# Patient Record
Sex: Female | Born: 1970 | Race: White | Hispanic: No | Marital: Married | Smoking: Never smoker
Health system: Southern US, Community
[De-identification: ages and names within clinical notes are randomized; demographics above are authoritative.]

## PROBLEM LIST (undated history)

## (undated) HISTORY — PX: MOUTH SURGERY: SHX715

---

## 1998-12-14 ENCOUNTER — Ambulatory Visit (HOSPITAL_COMMUNITY): Admission: RE | Admit: 1998-12-14 | Discharge: 1998-12-14 | Payer: Self-pay | Admitting: Obstetrics and Gynecology

## 1998-12-14 ENCOUNTER — Encounter: Payer: Self-pay | Admitting: Obstetrics and Gynecology

## 1999-02-22 ENCOUNTER — Ambulatory Visit (HOSPITAL_COMMUNITY): Admission: RE | Admit: 1999-02-22 | Discharge: 1999-02-22 | Payer: Self-pay | Admitting: Obstetrics and Gynecology

## 1999-03-18 ENCOUNTER — Ambulatory Visit (HOSPITAL_COMMUNITY): Admission: RE | Admit: 1999-03-18 | Discharge: 1999-03-18 | Payer: Self-pay | Admitting: Obstetrics and Gynecology

## 1999-03-18 ENCOUNTER — Encounter: Payer: Self-pay | Admitting: Obstetrics and Gynecology

## 1999-04-28 ENCOUNTER — Encounter (INDEPENDENT_AMBULATORY_CARE_PROVIDER_SITE_OTHER): Payer: Self-pay | Admitting: Specialist

## 1999-04-28 ENCOUNTER — Inpatient Hospital Stay (HOSPITAL_COMMUNITY): Admission: AD | Admit: 1999-04-28 | Discharge: 1999-05-01 | Payer: Self-pay | Admitting: Obstetrics and Gynecology

## 1999-06-11 ENCOUNTER — Other Ambulatory Visit: Admission: RE | Admit: 1999-06-11 | Discharge: 1999-06-11 | Payer: Self-pay | Admitting: Obstetrics and Gynecology

## 2000-02-06 ENCOUNTER — Other Ambulatory Visit: Admission: RE | Admit: 2000-02-06 | Discharge: 2000-02-06 | Payer: Self-pay | Admitting: Obstetrics and Gynecology

## 2000-02-27 ENCOUNTER — Encounter (INDEPENDENT_AMBULATORY_CARE_PROVIDER_SITE_OTHER): Payer: Self-pay | Admitting: Specialist

## 2000-02-27 ENCOUNTER — Other Ambulatory Visit: Admission: RE | Admit: 2000-02-27 | Discharge: 2000-02-27 | Payer: Self-pay | Admitting: Obstetrics and Gynecology

## 2000-07-16 ENCOUNTER — Other Ambulatory Visit: Admission: RE | Admit: 2000-07-16 | Discharge: 2000-07-16 | Payer: Self-pay | Admitting: Obstetrics and Gynecology

## 2000-08-13 ENCOUNTER — Ambulatory Visit (HOSPITAL_COMMUNITY): Admission: RE | Admit: 2000-08-13 | Discharge: 2000-08-13 | Payer: Self-pay | Admitting: Obstetrics and Gynecology

## 2000-08-13 ENCOUNTER — Encounter: Payer: Self-pay | Admitting: Obstetrics and Gynecology

## 2000-11-11 ENCOUNTER — Other Ambulatory Visit: Admission: RE | Admit: 2000-11-11 | Discharge: 2000-11-11 | Payer: Self-pay | Admitting: Obstetrics and Gynecology

## 2000-12-11 ENCOUNTER — Inpatient Hospital Stay (HOSPITAL_COMMUNITY): Admission: AD | Admit: 2000-12-11 | Discharge: 2000-12-11 | Payer: Self-pay | Admitting: Obstetrics and Gynecology

## 2000-12-29 ENCOUNTER — Inpatient Hospital Stay (HOSPITAL_COMMUNITY): Admission: AD | Admit: 2000-12-29 | Discharge: 2000-12-31 | Payer: Self-pay | Admitting: Obstetrics and Gynecology

## 2001-02-10 ENCOUNTER — Other Ambulatory Visit: Admission: RE | Admit: 2001-02-10 | Discharge: 2001-02-10 | Payer: Self-pay | Admitting: Obstetrics and Gynecology

## 2008-05-27 ENCOUNTER — Emergency Department (HOSPITAL_COMMUNITY): Admission: EM | Admit: 2008-05-27 | Discharge: 2008-05-27 | Payer: Self-pay | Admitting: Emergency Medicine

## 2010-06-07 NOTE — Discharge Summary (Signed)
[facility]  PatientLEI, Madison Garner Visit Number: [id number] MRN: [id number]          Service Type: OBS Location: [id number] Attending Physician:  Madison Garner Dictated by:   Malachi Pro. Ambrose Mantle, M.D. Admit Date:  12/29/2000 Discharge Date: 12/31/2000                             Discharge Summary  HISTORY OF PRESENT ILLNESS:   A 40 year old white single female, Para 1-0-0-1, Gravida 2, last period April 04, 2000, [facility] January 11, 2001 by dates and January 08, 2001 by ultrasound, admitted with prodromal labor.  Blood group and type A positive with a negative antibody. Nonreactive serology.  Rubella immune.  Hepatitis B surface antigen negative.  HIV declined.  GC and chlamydia  negative.  Triple screen normal.  One hour Glucola 100.  Group B Strep negative.  Vaginal ultrasound on May 28, 2000 crown rump 1.4 cm, 7 weeks 6 days, [facility] January 08, 2001.  Colposcopy at 16 weeks showed a large white area around cervical os with punctation.  Mosaicism at 11 to 12:00. Colposcopy repeated at 32 weeks with no change.  Repeat ultrasound August 13, 2000 mean gestational age [redacted] weeks 0 days, [facility] January 07, 2001.  The patient came for a visit on December 23, 2000, cervix was 3 cm.  She came again today complaining of contractions.  Cervix was 3+ cm and the patient came a maternity admission for evaluation.  PAST MEDICAL HISTORY:         No known allergies.  Migraines.  Operations: None.  FAMILY HISTORY:               Mother with MI, skin and cervical cancer. Bother with obsessive compulsive disorder and depression.  ALCOHOL/TOBACCO/DRUGS:        None.  OBSTETRIC HISTORY:            April 2001 a 6 pound 4 ounce female with premature rupture of membranes.  PHYSICAL EXAMINATION:         On admission revealed normal vital signs.  The abdomen was soft and nontender.  Fundal height was 36 cm on December 23, 2000. Cervix 3+ cm, 50%, vertex minus 2 to  minus 3 station.  HOSPITAL COURSE:              After admission to the  hospital at 6:13 p.m. the cervix was 3+ cm, 50%, vertex at minus 2.  Artificial rupture of membranes produced meconium stained fluid.  Pitocin was begun.  The patient then established a labor pattern. Received an epidural at 8:40 p.m.  At 9:10 p.m. the Pitocin was 8 mu a minute.  Contractions were every two to three minutes. The cervix was 8 cm, 100% vertex at 0 station.  She progressed to full dilatation and delivered spontaneously OA over a second degree midline laceration a living female infant 7 pounds 2 ounces, Apgars 9 at one and 9 at five minutes.  There was one loop of nuchal cord and there was mild shoulder dystocia managed by wood screw and McRoberts maneuvers.  The nose and pharynx were suctioned with the DeLee and the bulb and Dr. Salvadore Dom attended the baby. Intubation was not necessary.  Placenta was intact.  Uterus normal.  Rectal negative.  Second degree midline laceration repaired with 3-0 Dexon.  Blood loss was about 400 cc.  Dr. Ambrose Mantle was in  attendance.  Postpartum the patient did quite well and was discharged on the second postpartum day.  Initial hemoglobin 11.0, hematocrit 31.9, white count 10,900.  Platelet count 185,000.  Follow up hemoglobin 10.2, hematocrit 29.6, platelet count 172,000. White count 16,200.  RPR nonreactive.  FINAL DIAGNOSIS:              Intrauterine pregnancy at 38+ weeks, delivered                               occiput anterior.  OPERATION:                    Spontaneous delivery OA, repair of second                               degree midline laceration.  FINAL CONDITION:              Improved.  INSTRUCTIONS:                 Include our regular discharge instruction booklet.  The patient is advised to return to the office in six weeks for follow up examination.  She declines analgesics at discharge. Dictated by:   Malachi Pro. Ambrose Mantle, M.D. Attending Physician:  Madison Garner DD:  12/31/00 TD:  12/31/00 Job: [id number]

## 2010-06-07 NOTE — Discharge Summary (Signed)
[facility]  Patient:    Madison Garner, Madison Garner                     MRN: [id number] Adm. Date:  [id number] Disc. Date: [id number] Attending:  Oliver Pila                           Discharge Summary  ADMISSION DIAGNOSES:          1. Intrauterine pregnancy at 38 weeks.                               2. Premature rupture of membranes.  DISCHARGE DIAGNOSES:          1. Intrauterine pregnancy at 38 weeks.                               2. Premature rupture of membranes.  PROCEDURE:                    Spontaneous vaginal delivery.  COMPLICATIONS:                None.  CONSULTING PHYSICIANS:        None.  HISTORY OF PRESENT ILLNESS:   This is a 40 year old white female, gravida 1, para 0, with an EGA of [redacted] weeks by an LMP consistent with a first trimester ultrasound with an EDC of May 13, 1999, who presents with the complaint of questionable rupture of membranes.  She was unsure how long she had been ruptured.  She has een felt damp since the day prior to admission with good fetal movement, no vaginal  bleeding, and irregular mild contractions.  Prenatal care complicated by a placenta previa early on in pregnancy which resolved by a 32-week ultrasound.  It was otherwise uncomplicated.  PRENATAL LABORATORY DATA:     Blood type A positive with a negative antibody screen, RPR nonreactive, HIV negative, hepatitis B surface antigen negative, rubella immune.  Gonorrhea and Chlamydia negative.  Group B Strep was negative.   PAST MEDICAL HISTORY:         Significant for a history of an ASCUS Pap smear in April of 1999 and a history of migraine headaches and she has recently taken amoxicillin for sinusitis.  PHYSICAL EXAMINATION:         VITAL SIGNS: She was afebrile with stable vital signs.  Fetal heart rate tracing was reactive in the 130s.  Fundus was 38 cm, estimated fetal weight was 6-1/2 to 7 pounds.  Cervix was 2, 25%, -2 with positive pooling,  positive nitrazine, and Alvino Chapel, M.D. was able to perform artificial rupture of membranes of a forebag.  HOSPITAL COURSE:              The patient was admitted with premature rupture of membranes and started on low dose Pitocin induction after the forebag was ruptured. She progressed in active labor and received an epidural.  On the morning of April 29, 1999, she reached complete and pushed well and had an SVD of a viable female  infant with Apgars of 7 and 8 that weighed 6 pounds 8 ounces over a second degree laceration.  There was thick meconium noted after the vertex delivered and DeLee suctioned, also returned some thick meconium.  The NICU team was called.  They lso retrieved thick meconium from the stomach.  There was a nuchal cord x 2 and one of these loops was reduced.  The placenta was delivered spontaneous and was intact. Her second degree laceration was repaired with 3-0 Vicryl and her cervix and rectum were intact and estimated blood loss was less than 500 cc.  Postpartum, the patient did very well, remained afebrile, and breastfed her baby without complications. On the morning of postpartum day #2, she was stable for discharge home.  CONDITION ON DISCHARGE:       Stable.  DISPOSITION:                  Discharged to home.  DISCHARGE INSTRUCTIONS:       Her diet is a regular diet.  Her activity is pelvic rest.  FOLLOW-UP:                    Her follow-up is in four to six weeks and she is given our discharge summary pamphlet. DD:  05/01/99 TD:  05/01/99 Job: [id number]

## 2010-11-12 ENCOUNTER — Ambulatory Visit: Payer: Self-pay

## 2011-08-29 ENCOUNTER — Ambulatory Visit (INDEPENDENT_AMBULATORY_CARE_PROVIDER_SITE_OTHER): Payer: BC Managed Care – PPO | Admitting: Emergency Medicine

## 2011-08-29 VITALS — BP 126/80 | HR 81 | Temp 98.7°F | Resp 16 | Ht 60.0 in | Wt 185.0 lb

## 2011-08-29 DIAGNOSIS — R599 Enlarged lymph nodes, unspecified: Secondary | ICD-10-CM

## 2011-08-29 LAB — POCT CBC
Granulocyte percent: 68.4 %G (ref 37–80)
HCT, POC: 44.8 % (ref 37.7–47.9)
Hemoglobin: 13.9 g/dL (ref 12.2–16.2)
Lymph, poc: 2.4 (ref 0.6–3.4)
MCH, POC: 28.5 pg (ref 27–31.2)
MCHC: 31 g/dL — AB (ref 31.8–35.4)
MCV: 91.9 fL (ref 80–97)
MID (cbc): 0.6 (ref 0–0.9)
MPV: 8.5 fL (ref 0–99.8)
POC Granulocyte: 6.4 (ref 2–6.9)
POC LYMPH PERCENT: 25.5 %L (ref 10–50)
POC MID %: 6.1 %M (ref 0–12)
Platelet Count, POC: 387 10*3/uL (ref 142–424)
RBC: 4.87 M/uL (ref 4.04–5.48)
RDW, POC: 12.6 %
WBC: 9.4 10*3/uL (ref 4.6–10.2)

## 2011-08-29 LAB — POCT RAPID STREP A (OFFICE): Rapid Strep A Screen: NEGATIVE

## 2011-08-29 MED ORDER — DOXYCYCLINE HYCLATE 100 MG PO TABS: 100.0000 mg | ORAL_TABLET | Freq: Two times a day (BID) | ORAL | Status: AC

## 2011-08-29 NOTE — Progress Notes (Signed)
  Subjective:    Patient ID: Madison Garner, female    DOB: 24-Dec-1970, 41 y.o.   MRN: [id number]  HPIpatient presents with 2 day onset of lymph nodes swollen, posterior neck, they feel tender and like they are on fire. Denies fever or sore throat. Denies  sores or scalp problems. Patient otherwise healthy and denies any other complaints.     Review of Systems     Objective:   Physical Exam  On exam, patients tonsils do not appear enlarged, she does have a few small sores with her scalp area, does state she scratches this area a lot.   Results for orders placed in visit on 08/29/11  POCT RAPID STREP A (OFFICE)      Component Value Range   Rapid Strep A Screen Negative  Negative  POCT CBC      Component Value Range   WBC 9.4  4.6 - 10.2 K/uL   Lymph, poc 2.4  0.6 - 3.4   POC LYMPH PERCENT 25.5  10 - 50 %L   MID (cbc) 0.6  0 - 0.9   POC MID % 6.1  0 - 12 %M   POC Granulocyte 6.4  2 - 6.9   Granulocyte percent 68.4  37 - 80 %G   RBC 4.87  4.04 - 5.48 M/uL   Hemoglobin 13.9  12.2 - 16.2 g/dL   HCT, POC 13.0  86.5 - 47.9 %   MCV 91.9  80 - 97 fL   MCH, POC 28.5  27 - 31.2 pg   MCHC 31.0 (*) 31.8 - 35.4 g/dL   RDW, POC 78.4     Platelet Count, POC 387  142 - 424 K/uL   MPV 8.5  0 - 99.8 fL      Assessment & Plan:  Her blood count is normal. Suspect her symptoms are secondary to folliculitis of the scalp and secondary lymph adenopathy. We'll treat with doxycycline twice a day .

## 2011-08-29 NOTE — Patient Instructions (Signed)
Folliculitis  °Folliculitis is an infection and inflammation of the hair follicles. Hair follicles become red and irritated. This inflammation is usually caused by bacteria. The bacteria thrive in warm, moist environments. This condition can be seen anywhere on the body.  °CAUSES °The most common cause of folliculitis is an infection by germs (bacteria). Fungal and viral infections can also cause the condition. Viral infections may be more common in people whose bodies are unable to fight disease well (weakened immune systems). Examples include people with: °· AIDS.  °· An organ transplant.  °· Cancer.  °People with depressed immune systems, diabetes, or obesity, have a greater risk of getting folliculitis than the general population. Certain chemicals, especially oils and tars, also can cause folliculitis. °SYMPTOMS °· An early sign of folliculitis is a small, white or yellow pus-filled, itchy lesion (pustule). These lesions appear on a red, inflamed follicle. They are usually less than 5 mm (.20 inches).  °· The most likely starting points are the scalp, thighs, legs, back and buttocks. Folliculitis is also frequently found in areas of repeated shaving.  °· When an infection of the follicle goes deeper, it becomes a boil or furuncle. A group of closely packed boils create a larger lesion (a carbuncle). These sores (lesions) tend to occur in hairy, sweaty areas of the body.  °TREATMENT  °· A doctor who specializes in skin problems (dermatologists) treats mild cases of folliculitis with antiseptic washes.  °· They also use a skin application which kills germs (topical antibiotics). Tea tree oil is a good topical antiseptic as well. It can be found at a health food store. A small percentage of individuals may develop an allergy to the tea tree oil.  °· Mild to moderate boils respond well to warm water compresses applied three times daily.  °· In some cases, oral antibiotics should be taken with the skin treatment.    °· If lesions contain large quantities of pus or fluid, your caregiver may drain them. This allows the topical antibiotics to get to the affected areas better.  °· Stubborn cases of folliculitis may respond to laser hair removal. This process uses a high intensity light beam (a laser) to destroy the follicle and reduces the scarring from folliculitis. After laser hair removal, hair will no longer grow in the laser treated area.  °Patients with long-lasting folliculitis need to find out where the infection is coming from. Germs can live in the nostrils of the patient. This can trigger an outbreak now and then. Sometimes the bacteria live in the nostrils of a family member. This person does not develop the disorder but they repeatedly re-expose others to the germ. To break the cycle of recurrence in the patient, the family member must also undergo treatment. °PREVENTION  °· Individuals who are predisposed to folliculitis should be extremely careful about personal hygiene.  °· Application of antiseptic washes may help prevent recurrences.  °· A topical antibiotic cream, mupirocin (Bactroban®), has been effective at reducing bacteria in the nostrils. It is applied inside the nose with your little finger. This is done twice daily for a week. Then it is repeated every 6 months.  °· Because follicle disorders tend to come back, patients must receive follow-up care. Your caregiver may be able to recognize a recurrence before it becomes severe.  °SEEK IMMEDIATE MEDICAL CARE IF:  °· You develop redness, swelling, or increasing pain in the area.  °· You have a fever.  °· You are not improving with treatment   or are getting worse.  °· You have any other questions or concerns.  °Document Released: 03/17/2001 Document Revised: 12/26/2010 Document Reviewed: 01/12/2008 °ExitCare® Patient Information ©2012 [employer]. °

## 2012-11-17 ENCOUNTER — Ambulatory Visit: Payer: Self-pay | Admitting: General Practice

## 2013-02-16 DIAGNOSIS — N920 Excessive and frequent menstruation with regular cycle: Secondary | ICD-10-CM | POA: Insufficient documentation

## 2013-02-21 ENCOUNTER — Encounter: Payer: Self-pay | Admitting: Obstetrics & Gynecology

## 2013-03-09 ENCOUNTER — Ambulatory Visit: Payer: BC Managed Care – PPO | Admitting: Obstetrics & Gynecology

## 2013-03-15 ENCOUNTER — Ambulatory Visit: Payer: BC Managed Care – PPO | Admitting: Obstetrics

## 2013-03-30 ENCOUNTER — Encounter: Payer: Self-pay | Admitting: Obstetrics

## 2013-03-30 ENCOUNTER — Ambulatory Visit (INDEPENDENT_AMBULATORY_CARE_PROVIDER_SITE_OTHER): Payer: BC Managed Care – PPO | Admitting: Obstetrics

## 2013-03-30 VITALS — BP 145/83 | HR 76 | Wt 186.0 lb

## 2013-03-30 DIAGNOSIS — Z113 Encounter for screening for infections with a predominantly sexual mode of transmission: Secondary | ICD-10-CM

## 2013-03-30 DIAGNOSIS — Z01419 Encounter for gynecological examination (general) (routine) without abnormal findings: Secondary | ICD-10-CM

## 2013-03-30 DIAGNOSIS — Z0000 Encounter for general adult medical examination without abnormal findings: Secondary | ICD-10-CM

## 2013-03-30 DIAGNOSIS — N920 Excessive and frequent menstruation with regular cycle: Secondary | ICD-10-CM

## 2013-03-30 DIAGNOSIS — Z124 Encounter for screening for malignant neoplasm of cervix: Secondary | ICD-10-CM

## 2013-03-30 LAB — POCT URINALYSIS DIPSTICK
Bilirubin, UA: NEGATIVE
GLUCOSE UA: NEGATIVE
Ketones, UA: NEGATIVE
Nitrite, UA: NEGATIVE
Urobilinogen, UA: NEGATIVE
pH, UA: 5

## 2013-03-30 NOTE — Progress Notes (Signed)
Subjective:     Madison Garner is a 43 y.o. female here for a routine exam.  Current complaints: pt states that after consulting with Medical doctor she should see Dr Clearance CootsHarper for menstral  Cycles.  Pt states that family doctor didn't feel comfortable starting her on birth control for her history.  Pt states that her cycle have gotten worse over the last several years.  Pt states that they will ususally last 4-5 days.  Pt has hx of abnormal paps with colpo normal.  Pt has family hx of cervical ca.  Personal health questionnaire reviewed: yes.   Gynecologic History No LMP recorded. Contraception: none, partner has vasectomy Last Pap: unsure. Results were: had colpo, results normal Last mammogram: 2014. Results were: normal  Obstetric History OB History  Gravida Para Term Preterm AB SAB TAB Ectopic Multiple Living  2 2 2       2     # Outcome Date GA Lbr Len/2nd Weight Sex Delivery Anes PTL Lv  2 TRM 2002    F SVD EPI  Y  1 TRM 2001    F SVD EPI  Y       The following portions of the patient's history were reviewed and updated as appropriate: allergies, current medications, past family history, past medical history, past social history, past surgical history and problem list.  Review of Systems Pertinent items are noted in HPI.    Objective:    General appearance: alert and no distress Breasts: normal appearance, no masses or tenderness Abdomen: normal findings: soft, non-tender Pelvic: cervix normal in appearance, external genitalia normal, no adnexal masses or tenderness, no cervical motion tenderness, rectovaginal septum normal, uterus normal size, shape, and consistency and vagina normal without discharge    Assessment:    Healthy female exam.   AUB.  Does not want Hysterectomy.   Plan:    Education reviewed: Management of AUB.  Considering Endometrial Ablation.. Contraception: none. Follow up in: 2 weeks. Ultrasound ordered.

## 2013-03-31 ENCOUNTER — Other Ambulatory Visit: Payer: Self-pay | Admitting: *Deleted

## 2013-03-31 DIAGNOSIS — N920 Excessive and frequent menstruation with regular cycle: Secondary | ICD-10-CM

## 2013-03-31 LAB — WET PREP BY MOLECULAR PROBE
Candida species: NEGATIVE
GARDNERELLA VAGINALIS: POSITIVE — AB
Trichomonas vaginosis: NEGATIVE

## 2013-03-31 LAB — PAP IG W/ RFLX HPV ASCU

## 2013-03-31 LAB — GC/CHLAMYDIA PROBE AMP
CT Probe RNA: NEGATIVE
GC PROBE AMP APTIMA: NEGATIVE

## 2013-04-01 ENCOUNTER — Other Ambulatory Visit: Payer: Self-pay | Admitting: *Deleted

## 2013-04-01 DIAGNOSIS — B9689 Other specified bacterial agents as the cause of diseases classified elsewhere: Secondary | ICD-10-CM

## 2013-04-01 DIAGNOSIS — N760 Acute vaginitis: Principal | ICD-10-CM

## 2013-04-01 MED ORDER — METRONIDAZOLE 500 MG PO TABS: 500.0000 mg | ORAL_TABLET | Freq: Two times a day (BID) | ORAL | Status: DC

## 2013-04-08 ENCOUNTER — Ambulatory Visit (HOSPITAL_COMMUNITY): Payer: PRIVATE HEALTH INSURANCE

## 2013-09-01 ENCOUNTER — Ambulatory Visit (INDEPENDENT_AMBULATORY_CARE_PROVIDER_SITE_OTHER): Payer: PRIVATE HEALTH INSURANCE | Admitting: Family Medicine

## 2013-09-01 ENCOUNTER — Ambulatory Visit (INDEPENDENT_AMBULATORY_CARE_PROVIDER_SITE_OTHER): Payer: PRIVATE HEALTH INSURANCE

## 2013-09-01 VITALS — BP 142/84 | HR 85 | Temp 98.2°F | Resp 16 | Ht 59.75 in | Wt 188.0 lb

## 2013-09-01 DIAGNOSIS — R0602 Shortness of breath: Secondary | ICD-10-CM

## 2013-09-01 DIAGNOSIS — M546 Pain in thoracic spine: Secondary | ICD-10-CM

## 2013-09-01 DIAGNOSIS — A084 Viral intestinal infection, unspecified: Secondary | ICD-10-CM

## 2013-09-01 DIAGNOSIS — R8290 Unspecified abnormal findings in urine: Secondary | ICD-10-CM

## 2013-09-01 DIAGNOSIS — R82998 Other abnormal findings in urine: Secondary | ICD-10-CM

## 2013-09-01 DIAGNOSIS — A088 Other specified intestinal infections: Secondary | ICD-10-CM

## 2013-09-01 LAB — POCT CBC
Granulocyte percent: 79.9 %G (ref 37–80)
HCT, POC: 41.7 % (ref 37.7–47.9)
Hemoglobin: 13.7 g/dL (ref 12.2–16.2)
Lymph, poc: 2.3 (ref 0.6–3.4)
MCH, POC: 28.8 pg (ref 27–31.2)
MCHC: 32.8 g/dL (ref 31.8–35.4)
MCV: 88.1 fL (ref 80–97)
MID (cbc): 0.7 (ref 0–0.9)
MPV: 7.2 fL (ref 0–99.8)
POC Granulocyte: 12 — AB (ref 2–6.9)
POC LYMPH PERCENT: 15.5 %L (ref 10–50)
POC MID %: 4.6 % (ref 0–12)
Platelet Count, POC: 383 10*3/uL (ref 142–424)
RBC: 4.73 M/uL (ref 4.04–5.48)
RDW, POC: 12.6 %
WBC: 15 10*3/uL — AB (ref 4.6–10.2)

## 2013-09-01 LAB — POCT URINALYSIS DIPSTICK
Bilirubin, UA: NEGATIVE
Blood, UA: NEGATIVE
Glucose, UA: NEGATIVE
Ketones, UA: NEGATIVE
Nitrite, UA: NEGATIVE
Protein, UA: NEGATIVE
Spec Grav, UA: 1.025
Urobilinogen, UA: 0.2
pH, UA: 6.5

## 2013-09-01 LAB — POCT UA - MICROSCOPIC ONLY
Casts, Ur, LPF, POC: NEGATIVE
Crystals, Ur, HPF, POC: NEGATIVE
Mucus, UA: NEGATIVE
Yeast, UA: NEGATIVE

## 2013-09-01 NOTE — Progress Notes (Signed)
Chief Complaint:  Chief Complaint  Patient presents with  . Back Pain    spasms since 8/11    HPI: Madison Garner is a 43 y.o. female who is here for  3 day hx of bilateral back pain, she has it severe enough where she has called out of worked and laid down, took her muscle relaxer and that helped some. She made the mistake of not moving she thinks and that made it worse. She thinks that she may have pulled a muscle when she was throwing up due to a viral illness , may have eaten something wrong. She had diarrhea with it as well.  She currently has no fevers or chill, last episode of nausea, vomiting and diarrhea on Tuesdy, she has been eating and rinking ok now. Denies any urinary sxs or vaginal dc  History reviewed. No pertinent past medical history. Past Surgical History  Procedure Laterality Date  . Mouth surgery     History   Social History  . Marital Status: Significant Other    Spouse Name: N/A    Number of Children: N/A  . Years of Education: N/A   Social History Main Topics  . Smoking status: Never Smoker   . Smokeless tobacco: None  . Alcohol Use: Yes     Comment: social  . Drug Use: None  . Sexual Activity: Yes    Birth Control/ Protection: Surgical     Comment: partner had vasectomy   Other Topics Concern  . None   Social History Narrative  . None   Family History  Problem Relation Age of Onset  . Cancer Mother   . Heart disease Mother   . Hypertension Mother   . Macular degeneration Mother   . COPD Mother   . Stroke Mother   . Arthritis Mother   . Miscarriages / [place] Mother   . Hyperlipidemia Brother   . Cancer Maternal Grandfather   . Stroke Maternal Grandfather    No Known Allergies Prior to Admission medications   Medication Sig Start Date End Date Taking? Authorizing Provider  metroNIDAZOLE (FLAGYL) 500 MG tablet Take 1 tablet (500 mg total) by mouth 2 (two) times daily. 04/01/13   Brock Bad, MD     ROS: The patient  denies fevers, chills, night sweats, unintentional weight loss, chest pain, palpitations, wheezing, nausea, vomiting, abdominal pain, dysuria, hematuria, melena, numbness, weakness, or tingling. + SOB with taking deep breaths and walking but she has pain in back and that makes it hard for her to want to take deep breaths  All other systems have been reviewed and were otherwise negative with the exception of those mentioned in the HPI and as above.    PHYSICAL EXAM: Filed Vitals:   09/01/13 1637  BP: 142/84  Pulse: 85  Temp: 98.2 F (36.8 C)  Resp: 16   Filed Vitals:   09/01/13 1637  Height: 4' 11.75" (1.518 m)  Weight: 188 lb (85.276 kg)   Body mass index is 37.01 kg/(m^2).  General: Alert, no acute distress HEENT:  Normocephalic, atraumatic, oropharynx patent. EOMI, PERRLA Cardiovascular:  Regular rate and rhythm, no rubs murmurs or gallops.  No Carotid bruits, radial pulse intact. No pedal edema.  Respiratory: Clear to auscultation bilaterally.  No wheezes, rales, or rhonchi.  No cyanosis, no use of accessory musculature GI: No organomegaly, abdomen is soft and non-tender, positive bowel sounds.  No masses. Skin: No rashes. Neurologic: Facial musculature symmetric. Psychiatric: Patient is  appropriate throughout our interaction. Lymphatic: No cervical lymphadenopathy Musculoskeletal: Gait intact. + paramsk tenderness  Decrease ROM due to pain 5/5 strength, 2/2 DTRs in Haruko Mersch and UE No saddle anesthesia Straight leg negative Hip and knee exam--normal    LABS: Results for orders placed in visit on 09/01/13  POCT CBC      Result Value Ref Range   WBC 15.0 (*) 4.6 - 10.2 K/uL   Lymph, poc 2.3  0.6 - 3.4   POC LYMPH PERCENT 15.5  10 - 50 %L   MID (cbc) 0.7  0 - 0.9   POC MID % 4.6  0 - 12 %M   POC Granulocyte 12.0 (*) 2 - 6.9   Granulocyte percent 79.9  37 - 80 %G   RBC 4.73  4.04 - 5.48 M/uL   Hemoglobin 13.7  12.2 - 16.2 g/dL   HCT, POC 40.941.7  81.137.7 - 47.9 %   MCV 88.1   80 - 97 fL   MCH, POC 28.8  27 - 31.2 pg   MCHC 32.8  31.8 - 35.4 g/dL   RDW, POC 91.412.6     Platelet Count, POC 383  142 - 424 K/uL   MPV 7.2  0 - 99.8 fL  POCT UA - MICROSCOPIC ONLY      Result Value Ref Range   WBC, Ur, HPF, POC 8-12     RBC, urine, microscopic 0-2     Bacteria, U Microscopic small     Mucus, UA neg     Epithelial cells, urine per micros 3-6     Crystals, Ur, HPF, POC neg     Casts, Ur, LPF, POC neg     Yeast, UA neg    POCT URINALYSIS DIPSTICK      Result Value Ref Range   Color, UA yellow     Clarity, UA hazy     Glucose, UA neg     Bilirubin, UA neg     Ketones, UA neg     Spec Grav, UA 1.025     Blood, UA neg     pH, UA 6.5     Protein, UA neg     Urobilinogen, UA 0.2     Nitrite, UA neg     Leukocytes, UA small (1+)       EKG/XRAY:   [facility] No acute cardiopulmoanry process No free air, no onstruction   ASSESSMENT/PLAN: Encounter Diagnoses  Name Primary?  . Bilateral thoracic back pain Yes  . Viral gastroenteritis   . SOB (shortness of breath)   . Abnormal urine    Otc meds C/w flexeril Work note given  Urine cx pending, leukocytosis ?  due to GI illness, diarrhea and vomiting.  Currently all that has improved, No urinary sxs F/u prn   Gross sideeffects, risk and benefits, and alternatives of medications d/w patient. Patient is aware that all medications have potential sideeffects and we are unable to predict every sideeffect or drug-drug interaction that may occur.  Hamilton CapriLE, Caasi Giglia PHUONG, DO 09/02/2013 12:57 PM

## 2013-09-02 LAB — COMPLETE METABOLIC PANEL WITH GFR
Alkaline Phosphatase: 67 U/L (ref 39–117)
BUN: 14 mg/dL (ref 6–23)
CO2: 27 mEq/L (ref 19–32)
Creat: 0.73 mg/dL (ref 0.50–1.10)
GFR, Est African American: >89 mL/min
GFR, Est Non African American: >89 mL/min
Glucose, Bld: 92 mg/dL (ref 70–99)
Sodium: 138 mEq/L (ref 135–145)
Total Bilirubin: 0.3 mg/dL (ref 0.2–1.2)
Total Protein: 7.9 g/dL (ref 6.0–8.3)

## 2013-09-02 LAB — COMPLETE METABOLIC PANEL WITHOUT GFR
ALT: 16 U/L (ref 0–35)
AST: 19 U/L (ref 0–37)
Albumin: 4.4 g/dL (ref 3.5–5.2)
Calcium: 9.6 mg/dL (ref 8.4–10.5)
Chloride: 103 meq/L (ref 96–112)
Potassium: 4 meq/L (ref 3.5–5.3)

## 2013-09-03 LAB — URINE CULTURE: Colony Count: >=100000

## 2013-09-08 ENCOUNTER — Encounter: Payer: Self-pay | Admitting: Family Medicine

## 2013-11-15 ENCOUNTER — Ambulatory Visit: Payer: Self-pay | Admitting: General Practice

## 2014-01-16 ENCOUNTER — Encounter: Payer: Self-pay | Admitting: *Deleted

## 2014-11-01 ENCOUNTER — Other Ambulatory Visit: Payer: Self-pay | Admitting: Family Medicine

## 2014-11-01 DIAGNOSIS — Z1231 Encounter for screening mammogram for malignant neoplasm of breast: Secondary | ICD-10-CM

## 2014-11-07 ENCOUNTER — Ambulatory Visit
Admission: RE | Admit: 2014-11-07 | Discharge: 2014-11-07 | Disposition: A | Discharge status: Home / Self Care | Payer: PRIVATE HEALTH INSURANCE | Source: Ambulatory Visit | Attending: Family Medicine | Admitting: Family Medicine

## 2014-11-07 DIAGNOSIS — Z1231 Encounter for screening mammogram for malignant neoplasm of breast: Secondary | ICD-10-CM | POA: Insufficient documentation

## 2014-11-22 ENCOUNTER — Other Ambulatory Visit: Payer: Self-pay | Admitting: Family Medicine

## 2014-11-22 DIAGNOSIS — R928 Other abnormal and inconclusive findings on diagnostic imaging of breast: Secondary | ICD-10-CM

## 2015-08-31 ENCOUNTER — Other Ambulatory Visit: Payer: Self-pay | Admitting: Family Medicine

## 2015-09-01 LAB — CMP12+LP+TP+TSH+6AC+CBC/D/PLT
ALBUMIN: 4.5 g/dL (ref 3.5–5.5)
ALK PHOS: 70 IU/L (ref 39–117)
ALT: 13 IU/L (ref 0–32)
AST: 18 IU/L (ref 0–40)
Albumin/Globulin Ratio: 1.6 (ref 1.2–2.2)
BASOS: 1 %
BUN / CREAT RATIO: 17 (ref 9–23)
BUN: 13 mg/dL (ref 6–24)
Basophils Absolute: 0 10*3/uL (ref 0.0–0.2)
Bilirubin Total: 0.6 mg/dL (ref 0.0–1.2)
CALCIUM: 9.6 mg/dL (ref 8.7–10.2)
CHLORIDE: 101 mmol/L (ref 96–106)
CHOL/HDL RATIO: 3.6 ratio (ref 0.0–4.4)
Cholesterol, Total: 215 mg/dL — ABNORMAL HIGH (ref 100–199)
Creatinine, Ser: 0.78 mg/dL (ref 0.57–1.00)
EOS (ABSOLUTE): 0.1 10*3/uL (ref 0.0–0.4)
EOS: 2 %
ESTIMATED CHD RISK: 0.6 times avg. (ref 0.0–1.0)
Free Thyroxine Index: 1.8 (ref 1.2–4.9)
GFR calc Af Amer: 108 mL/min/{1.73_m2} (ref >59)
GFR calc non Af Amer: 93 mL/min/{1.73_m2} (ref >59)
GGT: 13 IU/L (ref 0–60)
Globulin, Total: 2.9 g/dL (ref 1.5–4.5)
Glucose: 77 mg/dL (ref 65–99)
HDL: 60 mg/dL (ref >39)
HEMATOCRIT: 39.8 % (ref 34.0–46.6)
Hemoglobin: 13.1 g/dL (ref 11.1–15.9)
IMMATURE GRANS (ABS): 0 10*3/uL (ref 0.0–0.1)
Immature Granulocytes: 0 %
Iron: 141 ug/dL (ref 27–159)
LDH: 189 IU/L (ref 119–226)
LDL CALC: 142 mg/dL — AB (ref 0–99)
LYMPHS ABS: 1.8 10*3/uL (ref 0.7–3.1)
Lymphs: 32 %
MCH: 28.5 pg (ref 26.6–33.0)
MCHC: 32.9 g/dL (ref 31.5–35.7)
MCV: 87 fL (ref 79–97)
MONOS ABS: 0.4 10*3/uL (ref 0.1–0.9)
Monocytes: 7 %
NEUTROS ABS: 3.3 10*3/uL (ref 1.4–7.0)
Neutrophils: 58 %
POTASSIUM: 4.3 mmol/L (ref 3.5–5.2)
Phosphorus: 3.2 mg/dL (ref 2.5–4.5)
Platelets: 359 10*3/uL (ref 150–379)
RBC: 4.6 x10E6/uL (ref 3.77–5.28)
RDW: 13.7 % (ref 12.3–15.4)
SODIUM: 139 mmol/L (ref 134–144)
T3 Uptake Ratio: 25 % (ref 24–39)
T4, Total: 7 ug/dL (ref 4.5–12.0)
TSH: 1.7 u[IU]/mL (ref 0.450–4.500)
Total Protein: 7.4 g/dL (ref 6.0–8.5)
Triglycerides: 65 mg/dL (ref 0–149)
URIC ACID: 4.9 mg/dL (ref 2.5–7.1)
VLDL Cholesterol Cal: 13 mg/dL (ref 5–40)
WBC: 5.6 10*3/uL (ref 3.4–10.8)

## 2015-09-01 LAB — HGB A1C W/O EAG: Hgb A1c MFr Bld: 5.1 % (ref 4.8–5.6)

## 2015-11-12 ENCOUNTER — Ambulatory Visit
Admission: RE | Admit: 2015-11-12 | Discharge: 2015-11-12 | Disposition: A | Discharge status: Home / Self Care | Payer: PRIVATE HEALTH INSURANCE | Source: Ambulatory Visit | Attending: Family Medicine | Admitting: Family Medicine

## 2015-11-12 ENCOUNTER — Ambulatory Visit
Admission: RE | Admit: 2015-11-12 | Discharge: 2015-11-12 | Disposition: A | Discharge status: Home / Self Care | Payer: Self-pay | Source: Ambulatory Visit | Attending: *Deleted | Admitting: *Deleted

## 2015-11-12 ENCOUNTER — Other Ambulatory Visit: Payer: Self-pay | Admitting: Family Medicine

## 2015-11-12 ENCOUNTER — Other Ambulatory Visit: Payer: Self-pay | Admitting: *Deleted

## 2015-11-12 DIAGNOSIS — Z1231 Encounter for screening mammogram for malignant neoplasm of breast: Secondary | ICD-10-CM | POA: Diagnosis not present

## 2016-03-20 ENCOUNTER — Telehealth: Payer: Self-pay | Admitting: *Deleted

## 2016-03-20 MED ORDER — PSEUDOEPHEDRINE HCL 30 MG PO TABS: 30.0000 mg | ORAL_TABLET | ORAL | 0 refills | Status: AC | PRN

## 2016-03-20 MED ORDER — DIPHENHYDRAMINE HCL 25 MG PO CAPS: ORAL_CAPSULE | ORAL | 0 refills | Status: DC

## 2016-03-20 MED ORDER — FEXOFENADINE HCL 180 MG PO TABS
180.0000 mg | ORAL_TABLET | Freq: Every day | ORAL | 3 refills | Status: DC
Start: 2016-03-20 — End: 2017-03-17

## 2016-03-20 NOTE — Telephone Encounter (Signed)
Last Rx sudafed 10/09/2015.  Avoid driving and alcohol when taking these medications as they can cause drowsiness and/or insomnia.  Patient sometimes does not get relief with allegra and switches off with benadryl generic.  Rx entered for patient pick up.  Patient verbalized understanding information/instructions, agreed with plan of care and had no further questions at this time.

## 2016-03-20 NOTE — Telephone Encounter (Signed)
Clarified Rx request. Pseudoephedrine and diphenhydramine. Also requests Fexofenadine. Rx from PCP that normally fills expired last month. Also requests  Flonase spray.

## 2016-03-20 NOTE — Telephone Encounter (Signed)
Pt requesting refills of phenylephrine and diphenhydramine. Would like Rx to be able to purchase with flex spending.  Preferred pharmacy for e-rx updated to [facility].

## 2016-04-29 ENCOUNTER — Ambulatory Visit: Payer: Self-pay | Admitting: Registered Nurse

## 2016-04-29 VITALS — BP 124/84 | HR 57 | Temp 98.6°F

## 2016-04-29 DIAGNOSIS — J301 Allergic rhinitis due to pollen: Secondary | ICD-10-CM

## 2016-04-29 DIAGNOSIS — H6593 Unspecified nonsuppurative otitis media, bilateral: Secondary | ICD-10-CM

## 2016-04-29 DIAGNOSIS — H65196 Other acute nonsuppurative otitis media, recurrent, bilateral: Secondary | ICD-10-CM

## 2016-04-29 MED ORDER — SALINE SPRAY 0.65 % NA SOLN: 2.0000 | NASAL | 0 refills | Status: DC

## 2016-04-29 MED ORDER — AMOXICILLIN 875 MG PO TABS: 875.0000 mg | ORAL_TABLET | Freq: Two times a day (BID) | ORAL | 0 refills | Status: AC

## 2016-04-29 NOTE — Progress Notes (Signed)
Subjective:    Patient ID: Madison Garner, female    DOB: August 01, 1970, 46 y.o.   MRN: [id number]  45y/o caucasian female married with c/o bil ear pain with muffled sounds. Denies drainage. Feels like there is fluid in ears. Hears it in R ear.   Right hurts more than left.  Has been taking allergy medications year round through winter but noticed increased post nasal drip and congestion when outside recently.  Denied fever/chills/nausea/vomiting/body aches/swelling/dyspnea/chest pain.  Using nasal saline in the shower; flonase 1 spray twice a day and allegra po daily.  Benadryl  po prn breakthrough rhinorrhea.      Review of Systems  Constitutional: Negative for activity change, appetite change, chills, diaphoresis, fatigue, fever and unexpected weight change.  HENT: Positive for congestion, ear pain, hearing loss, postnasal drip, rhinorrhea and sore throat. Negative for dental problem, drooling, ear discharge, facial swelling, mouth sores, nosebleeds, sinus pain, sinus pressure, sneezing, tinnitus, trouble swallowing and voice change.   Eyes: Negative for photophobia, pain, discharge, redness, itching and visual disturbance.  Respiratory: Positive for cough. Negative for choking, chest tightness, shortness of breath, wheezing and stridor.   Cardiovascular: Negative for chest pain, palpitations and leg swelling.  Gastrointestinal: Negative for abdominal distention, abdominal pain, blood in stool, constipation, diarrhea, nausea and vomiting.  Endocrine: Negative for cold intolerance and heat intolerance.  Genitourinary: Negative for difficulty urinating, dysuria and hematuria.  Musculoskeletal: Negative for arthralgias, back pain, gait problem, joint swelling, myalgias, neck pain and neck stiffness.  Skin: Negative for color change, pallor, rash and wound.  Allergic/Immunologic: Positive for environmental allergies. Negative for food allergies.  Neurological: Negative for dizziness, tremors,  seizures, syncope, facial asymmetry, speech difficulty, weakness, light-headedness, numbness and headaches.  Hematological: Negative for adenopathy. Does not bruise/bleed easily.  Psychiatric/Behavioral: Negative for agitation, behavioral problems, confusion and sleep disturbance.       Objective:   Physical Exam  Constitutional: She is oriented to person, place, and time. Vital signs are normal. She appears well-developed and well-nourished. She is active and cooperative.  Non-toxic appearance. She does not have a sickly appearance. She does not appear ill. No distress.  HENT:  Head: Normocephalic and atraumatic.  Right Ear: Hearing, external ear and ear canal normal. Tympanic membrane is erythematous and bulging. A middle ear effusion is present.  Left Ear: Hearing, external ear and ear canal normal. Tympanic membrane is erythematous and bulging. A middle ear effusion is present.  Nose: Mucosal edema and rhinorrhea present. No nose lacerations, sinus tenderness, nasal deformity, septal deviation or nasal septal hematoma. No epistaxis.  No foreign bodies. Right sinus exhibits no maxillary sinus tenderness and no frontal sinus tenderness. Left sinus exhibits no maxillary sinus tenderness and no frontal sinus tenderness.  Mouth/Throat: Uvula is midline and mucous membranes are normal. Mucous membranes are not pale, not dry and not cyanotic. She does not have dentures. No oral lesions. No trismus in the jaw. Normal dentition. No dental abscesses, uvula swelling, lacerations or dental caries. Posterior oropharyngeal edema and posterior oropharyngeal erythema present. No oropharyngeal exudate or tonsillar abscesses.  Bilateral TMs with air fluid level bulging clear; tm erythematous and adjacent proximal auditory canal; cobblestoning posterior pharynx; bilateral allergic shiners; bilateral nasal turbinates edema/erythema clear discharge  Eyes: Conjunctivae, EOM and lids are normal. Pupils are equal,  round, and reactive to light. Right eye exhibits no chemosis, no discharge, no exudate and no hordeolum. No foreign body present in the right eye. Left eye exhibits no chemosis, no discharge, no  exudate and no hordeolum. No foreign body present in the left eye. Right conjunctiva is not injected. Right conjunctiva has no hemorrhage. Left conjunctiva is not injected. Left conjunctiva has no hemorrhage. No scleral icterus. Right eye exhibits normal extraocular motion and no nystagmus. Left eye exhibits normal extraocular motion and no nystagmus. Right pupil is round and reactive. Left pupil is round and reactive. Pupils are equal.  Neck: Trachea normal and normal range of motion. Neck supple. No tracheal tenderness, no spinous process tenderness and no muscular tenderness present. No neck rigidity. No tracheal deviation, no edema, no erythema and normal range of motion present. No thyroid mass and no thyromegaly present.  Cardiovascular: Normal rate, regular rhythm, S1 normal, S2 normal, normal heart sounds and intact distal pulses.  PMI is not displaced.  Exam reveals no gallop and no friction rub.   No murmur heard. Pulmonary/Chest: Effort normal and breath sounds normal. No accessory muscle usage or stridor. No respiratory distress. She has no decreased breath sounds. She has no wheezes. She has no rhonchi. She has no rales. She exhibits no tenderness.  Speaks full sentences without difficulty; no cough observed  Abdominal: Soft. She exhibits no distension.  Musculoskeletal: Normal range of motion. She exhibits no edema or tenderness.       Right shoulder: Normal.       Left shoulder: Normal.       Right hip: Normal.       Left hip: Normal.       Right knee: Normal.       Left knee: Normal.       Cervical back: Normal.       Right hand: Normal.       Left hand: Normal.  Lymphadenopathy:       Head (right side): No submental, no submandibular, no tonsillar, no preauricular, no posterior auricular  and no occipital adenopathy present.       Head (left side): No submental, no submandibular, no tonsillar, no preauricular, no posterior auricular and no occipital adenopathy present.    She has no cervical adenopathy.       Right cervical: No superficial cervical, no deep cervical and no posterior cervical adenopathy present.      Left cervical: No superficial cervical, no deep cervical and no posterior cervical adenopathy present.  Neurological: She is alert and oriented to person, place, and time. She has normal strength. She is not disoriented. She displays no atrophy and no tremor. No cranial nerve deficit or sensory deficit. She exhibits normal muscle tone. She displays no seizure activity. Coordination and gait normal. GCS eye subscore is 4. GCS verbal subscore is 5. GCS motor subscore is 6.  Skin: Skin is warm, dry and intact. No abrasion, no bruising, no burn, no ecchymosis, no laceration, no lesion, no petechiae and no rash noted. She is not diaphoretic. No cyanosis or erythema. No pallor. Nails show no clubbing.  Psychiatric: She has a normal mood and affect. Her speech is normal and behavior is normal. Judgment and thought content normal. Cognition and memory are normal.  Nursing note and vitals reviewed.         Assessment & Plan:  A-acute recurrent bilateral nonssuportive otitis media; otitis media effusion bilaterally; seasonal allergic rhinitis  P-continue flonase 1 spray each nostril BID has at home.  Nasal saline 2 sprays each nostril q2h wa and aggressive use in shower BID.  Tylenol  po QID prn pain and/or motrin  po TID prn pain OR naproxen   po BID.  Pick either motrin (ibuprofen/advil) or naproxen (aleve) do not take both in same 24 hour period.  Amoxicillin  po BID x 10 days #20 RF0 dispensed from [facility].  Treatment as ordered.  Symptomatic therapy suggested fluids, NSAIDs and rest.  May take Tylenol or Motrin for fevers.  Call or return to clinic as needed  if these symptoms worsen or fail to improve as anticipated. Exitcare handout on otitis media given to patient.  Patient verbalized agreement and understanding of treatment plan.   P2:  Hand washing  Supportive treatment.   No evidence of invasive bacterial infection, non toxic and well hydrated.  This is most likely self limiting viral infection.  I do not see where any further testing or imaging is necessary at this time.   I will suggest supportive care, rest, good hygiene and encourage the patient to take adequate fluids.  The patient is to return to clinic or EMERGENCY ROOM if symptoms worsen or change significantly e.g. ear pain, fever, purulent discharge from ears or bleeding.  Exitcare handout on otitis media with effusion given to patient.  Patient verbalized agreement and understanding of treatment plan.    Continue allegra po daily, nasal saline BID minimum,  Shower BID.  Continue flonase 1 spray each nostril BID at home.  Caution with prn benadryl use as already taking allegra that is also antihistamine.  Avoid triggers if possible.  Shower prior to bedtime if exposed to triggers.  If allergic dust/dust mites recommend mattress/pillow covers/encasements; washing linens, vacuuming, sweeping, dusting weekly.  Call or return to clinic as needed if these symptoms worsen or fail to improve as anticipated.   Exitcare handout on allergic rhinitis given to patient.  Patient verbalized understanding of instructions, agreed with plan of care and had no further questions at this time.  P2:  Avoidance and hand washing.

## 2016-04-29 NOTE — Patient Instructions (Addendum)
Shower twice a day Start amoxicillin  by mouth twice a day for 10 days Continue flonase 1 spray each nostril twice a day contnue nasal saline 2 sprays each nostril at least twice a day Continue allegra  by mouth once a day Honey with lemon, hydrate, broth can help with sore throat Avoid triggers/pollen/dust Consider wearing mask on high pollen count/dust days Tylenol  by mouth every 6 hours as needed for pain AND if not controlling pain may add motrin (ibuprofen/advil)  by mouth every 8 hours as needed OR naproxen (aleve/naprosyn)  by mouth twice a day Choose either advil or aleve do not take both within 24 hours (same day)  Allergic Rhinitis Allergic rhinitis is when the mucous membranes in the nose respond to allergens. Allergens are particles in the air that cause your body to have an allergic reaction. This causes you to release allergic antibodies. Through a chain of events, these eventually cause you to release histamine into the blood stream. Although meant to protect the body, it is this release of histamine that causes your discomfort, such as frequent sneezing, congestion, and an itchy, runny nose. What are the causes? Seasonal allergic rhinitis (hay fever) is caused by pollen allergens that may come from grasses, trees, and weeds. Year-round allergic rhinitis (perennial allergic rhinitis) is caused by allergens such as house dust mites, pet dander, and mold spores. What are the signs or symptoms?  Nasal stuffiness (congestion).  Itchy, runny nose with sneezing and tearing of the eyes. How is this diagnosed? Your health care provider can help you determine the allergen or allergens that trigger your symptoms. If you and your health care provider are unable to determine the allergen, skin or blood testing may be used. Your health care provider will diagnose your condition after taking your health history and performing a physical exam. Your health care provider  may assess you for other related conditions, such as asthma, pink eye, or an ear infection. How is this treated? Allergic rhinitis does not have a cure, but it can be controlled by:  Medicines that block allergy symptoms. These may include allergy shots, nasal sprays, and oral antihistamines.  Avoiding the allergen. Hay fever may often be treated with antihistamines in pill or nasal spray forms. Antihistamines block the effects of histamine. There are over-the-counter medicines that may help with nasal congestion and swelling around the eyes. Check with your health care provider before taking or giving this medicine. If avoiding the allergen or the medicine prescribed do not work, there are many new medicines your health care provider can prescribe. Stronger medicine may be used if initial measures are ineffective. Desensitizing injections can be used if medicine and avoidance does not work. Desensitization is when a patient is given ongoing shots until the body becomes less sensitive to the allergen. Make sure you follow up with your health care provider if problems continue. Follow these instructions at home: It is not possible to completely avoid allergens, but you can reduce your symptoms by taking steps to limit your exposure to them. It helps to know exactly what you are allergic to so that you can avoid your specific triggers. Contact a health care provider if:  You have a fever.  You develop a cough that does not stop easily (persistent).  You have shortness of breath.  You start wheezing.  Symptoms interfere with normal daily activities. This information is not intended to replace advice given to you by your health care provider. Make sure you  discuss any questions you have with your health care provider. Document Released: 10/01/2000 Document Revised: 09/07/2015 Document Reviewed: 09/13/2012 Elsevier Interactive Patient Education  2017 [employer] Otitis Media With Effusion,  Pediatric Otitis media with effusion (OME) occurs when there is inflammation of the middle ear and fluid in the middle ear space. There are no signs and symptoms of infection. The middle ear space contains air and the bones for hearing. Air in the middle ear space helps to transmit sound to the brain. OME is a common condition in children, and it often occurs after an ear infection. This condition may be present for several weeks or longer after an ear infection. Most cases of this condition get better on their own. What are the causes? OME is caused by a blockage of the eustachian tube in one or both ears. These tubes drain fluid in the ears to the back of the nose (nasopharynx). If the tissue in the tube swells up (edema), the tube closes. This prevents fluid from draining. Blockage can be caused by:  Ear infections.  Colds and other upper respiratory infections.  Allergies.  Irritants, such as tobacco smoke.  Enlarged adenoids. The adenoids are areas of soft tissue located high in the back of the throat, behind the nose and the roof of the mouth. They are part of the body's natural defense (immune) system.  A mass in the nasopharynx.  Damage to the ear caused by pressure changes (barotrauma). What increases the risk? Your child is more likely to develop this condition if:  He or she has repeated ear and sinus infections.  He or she has allergies.  He or she is exposed to tobacco smoke.  He or she attends daycare.  He or she is not breastfed. What are the signs or symptoms? Symptoms of this condition may not be obvious. Sometimes this condition does not have any symptoms, or symptoms may overlap with those of a cold or upper respiratory tract illness. Symptoms of this condition include:  Temporary hearing loss.  A feeling of fullness in the ear without pain.  Irritability or agitation.  Balance (vestibular) problems. As a result of hearing loss, your child may:  Listen  to the TV at a loud volume.  Not respond to questions.  Ask "What?" often when spoken to.  Mistake or confuse one sound or word for another.  Perform poorly at school.  Have a poor attention span.  Become agitated or irritated easily. How is this diagnosed? This condition is diagnosed with an ear exam. Your child's health care provider will look inside your child's ear with an instrument (otoscope) to check for redness, swelling, and fluid. Other tests may be done, including:  A test to check the movement of the eardrum (pneumatic otoscopy). This is done by squeezing a small amount of air into the ear.  A test that changes air pressure in the middle ear to check how well the eardrum moves and to see if the eustachian tube is working (tympanogram).  Hearing test (audiogram). This test involves playing tones at different pitches to see if your child can hear each tone. How is this treated? Treatment for this condition depends on the cause. In many cases, the fluid goes away on its own. In some cases, your child may need a procedure to create a hole in the eardrum to allow fluid to drain (myringotomy) and to insert small drainage tubes (tympanostomy tubes) into the eardrums. These tubes help to drain fluid and  prevent infection. This procedure may be recommended if:  OME does not get better over several months.  Your child has many ear infections within several months.  Your child has noticeable hearing loss.  Your child has problems with speech and language development. Surgery may also be done to remove the adenoids (adenoidectomy). Follow these instructions at home:  Give over-the-counter and prescription medicines only as told by your child's health care provider.  Keep children away from any tobacco smoke.  Keep all follow-up visits as told by your child's health care provider. This is important. How is this prevented?  Keep your child's vaccinations up to date. Make sure  your child gets all recommended vaccinations, including a pneumonia and flu vaccine.  Encourage hand washing. Your child should wash his or her hands often with soap and water. If there is no soap and water, he or she should use hand sanitizer.  Avoid exposing your child to tobacco smoke.  Breastfeed your baby, if possible. Babies who are breastfed as long as possible are less likely to develop this condition. Contact a health care provider if:  Your child's hearing does not get better after 3 months.  Your child's hearing is worse.  Your child has ear pain.  Your child has a fever.  Your child has drainage from the ear.  Your child is dizzy.  Your child has a lump on his or her neck. Get help right away if:  Your child has bleeding from the nose.  Your child cannot move part of her or his face.  Your child has trouble breathing.  Your child cannot smell.  Your child develops severe congestion.  Your child develops weakness.  Your child who is younger than 3 months has a temperature of 100F (38C) or higher. Summary  Otitis media with effusion (OME) occurs when there is inflammation of the middle ear and fluid in the middle ear space.  This condition is caused by blockage of one or both eustachian tubes, which drain fluid in the ears to the back of the nose.  Symptoms of this condition can include temporary hearing loss, a feeling of fullness in the ear, irritability or agitation, and balance (vestibular) problems. Sometimes, there are no symptoms.  This condition is diagnosed with an ear exam and tests, such as pneumatic otoscopy, tympanogram, and audiogram.  Treatment for this condition depends on the cause. In many cases, the fluid goes away on its own. This information is not intended to replace advice given to you by your health care provider. Make sure you discuss any questions you have with your health care provider. Document Released: 03/29/2003 Document  Revised: 11/29/2015 Document Reviewed: 11/29/2015 Elsevier Interactive Patient Education  2017 [employer] Otitis Media, Adult Otitis media is redness, soreness, and puffiness (swelling) in the space just behind your eardrum (middle ear). It may be caused by allergies or infection. It often happens along with a cold. Follow these instructions at home:  Take your medicine as told. Finish it even if you start to feel better.  Only take over-the-counter or prescription medicines for pain, discomfort, or fever as told by your doctor.  Follow up with your doctor as told. Contact a doctor if:  You have otitis media only in one ear, or bleeding from your nose, or both.  You notice a lump on your neck.  You are not getting better in 3-5 days.  You feel worse instead of better. Get help right away if:  You have  pain that is not helped with medicine.  You have puffiness, redness, or pain around your ear.  You get a stiff neck.  You cannot move part of your face (paralysis).  You notice that the bone behind your ear hurts when you touch it. This information is not intended to replace advice given to you by your health care provider. Make sure you discuss any questions you have with your health care provider. Document Released: 06/25/2007 Document Revised: 06/14/2015 Document Reviewed: 08/03/2012 Elsevier Interactive Patient Education  2017 [employer].

## 2016-09-02 ENCOUNTER — Ambulatory Visit: Payer: Self-pay | Admitting: *Deleted

## 2016-09-02 ENCOUNTER — Ambulatory Visit: Payer: Self-pay | Admitting: Registered Nurse

## 2016-09-02 VITALS — BP 114/74 | HR 60 | Ht 60.0 in | Wt 145.0 lb

## 2016-09-02 VITALS — BP 114/74 | HR 59 | Temp 98.6°F

## 2016-09-02 DIAGNOSIS — Z0000 Encounter for general adult medical examination without abnormal findings: Secondary | ICD-10-CM

## 2016-09-02 DIAGNOSIS — J029 Acute pharyngitis, unspecified: Secondary | ICD-10-CM

## 2016-09-02 DIAGNOSIS — H6593 Unspecified nonsuppurative otitis media, bilateral: Secondary | ICD-10-CM

## 2016-09-02 MED ORDER — SALINE SPRAY 0.65 % NA SOLN: 2.0000 | NASAL | 0 refills | Status: DC

## 2016-09-02 MED ORDER — AMOXICILLIN 875 MG PO TABS: 875.0000 mg | ORAL_TABLET | Freq: Two times a day (BID) | ORAL | 0 refills | Status: DC

## 2016-09-02 NOTE — Patient Instructions (Signed)
Pharyngitis Pharyngitis is redness, pain, and swelling (inflammation) of your pharynx. What are the causes? Pharyngitis is usually caused by infection. Most of the time, these infections are from viruses (viral) and are part of a cold. However, sometimes pharyngitis is caused by bacteria (bacterial). Pharyngitis can also be caused by allergies. Viral pharyngitis may be spread from person to person by coughing, sneezing, and personal items or utensils (cups, forks, spoons, toothbrushes). Bacterial pharyngitis may be spread from person to person by more intimate contact, such as kissing. What are the signs or symptoms? Symptoms of pharyngitis include:  Sore throat.  Tiredness (fatigue).  Low-grade fever.  Headache.  Joint pain and muscle aches.  Skin rashes.  Swollen lymph nodes.  Plaque-like film on throat or tonsils (often seen with bacterial pharyngitis).  How is this diagnosed? Your health care provider will ask you questions about your illness and your symptoms. Your medical history, along with a physical exam, is often all that is needed to diagnose pharyngitis. Sometimes, a rapid strep test is done. Other lab tests may also be done, depending on the suspected cause. How is this treated? Viral pharyngitis will usually get better in 3-4 days without the use of medicine. Bacterial pharyngitis is treated with medicines that kill germs (antibiotics). Follow these instructions at home:  Drink enough water and fluids to keep your urine clear or pale yellow.  Only take over-the-counter or prescription medicines as directed by your health care provider: ? If you are prescribed antibiotics, make sure you finish them even if you start to feel better. ? Do not take aspirin.  Get lots of rest.  Gargle with 8 oz of salt water ( tsp of salt per 1 qt of water) as often as every 1-2 hours to soothe your throat.  Throat lozenges (if you are not at risk for choking) or sprays may be used to  soothe your throat. Contact a health care provider if:  You have large, tender lumps in your neck.  You have a rash.  You cough up green, yellow-brown, or bloody spit. Get help right away if:  Your neck becomes stiff.  You drool or are unable to swallow liquids.  You vomit or are unable to keep medicines or liquids down.  You have severe pain that does not go away with the use of recommended medicines.  You have trouble breathing (not caused by a stuffy nose). This information is not intended to replace advice given to you by your health care provider. Make sure you discuss any questions you have with your health care provider. Document Released: 01/06/2005 Document Revised: 06/14/2015 Document Reviewed: 09/13/2012 Elsevier Interactive Patient Education  2017 [employer] Otitis Media With Effusion, Pediatric Otitis media with effusion (OME) occurs when there is inflammation of the middle ear and fluid in the middle ear space. There are no signs and symptoms of infection. The middle ear space contains air and the bones for hearing. Air in the middle ear space helps to transmit sound to the brain. OME is a common condition in children, and it often occurs after an ear infection. This condition may be present for several weeks or longer after an ear infection. Most cases of this condition get better on their own. What are the causes? OME is caused by a blockage of the eustachian tube in one or both ears. These tubes drain fluid in the ears to the back of the nose (nasopharynx). If the tissue in the tube swells up (edema), the  tube closes. This prevents fluid from draining. Blockage can be caused by:  Ear infections.  Colds and other upper respiratory infections.  Allergies.  Irritants, such as tobacco smoke.  Enlarged adenoids. The adenoids are areas of soft tissue located high in the back of the throat, behind the nose and the roof of the mouth. They are part of the body's  natural defense (immune) system.  A mass in the nasopharynx.  Damage to the ear caused by pressure changes (barotrauma).  What increases the risk? Your child is more likely to develop this condition if:  He or she has repeated ear and sinus infections.  He or she has allergies.  He or she is exposed to tobacco smoke.  He or she attends daycare.  He or she is not breastfed.  What are the signs or symptoms? Symptoms of this condition may not be obvious. Sometimes this condition does not have any symptoms, or symptoms may overlap with those of a cold or upper respiratory tract illness. Symptoms of this condition include:  Temporary hearing loss.  A feeling of fullness in the ear without pain.  Irritability or agitation.  Balance (vestibular) problems.  As a result of hearing loss, your child may:  Listen to the TV at a loud volume.  Not respond to questions.  Ask "What?" often when spoken to.  Mistake or confuse one sound or word for another.  Perform poorly at school.  Have a poor attention span.  Become agitated or irritated easily.  How is this diagnosed? This condition is diagnosed with an ear exam. Your child's health care provider will look inside your child's ear with an instrument (otoscope) to check for redness, swelling, and fluid. Other tests may be done, including:  A test to check the movement of the eardrum (pneumatic otoscopy). This is done by squeezing a small amount of air into the ear.  A test that changes air pressure in the middle ear to check how well the eardrum moves and to see if the eustachian tube is working (tympanogram).  Hearing test (audiogram). This test involves playing tones at different pitches to see if your child can hear each tone.  How is this treated? Treatment for this condition depends on the cause. In many cases, the fluid goes away on its own. In some cases, your child may need a procedure to create a hole in the  eardrum to allow fluid to drain (myringotomy) and to insert small drainage tubes (tympanostomy tubes) into the eardrums. These tubes help to drain fluid and prevent infection. This procedure may be recommended if:  OME does not get better over several months.  Your child has many ear infections within several months.  Your child has noticeable hearing loss.  Your child has problems with speech and language development.  Surgery may also be done to remove the adenoids (adenoidectomy). Follow these instructions at home:  Give over-the-counter and prescription medicines only as told by your child's health care provider.  Keep children away from any tobacco smoke.  Keep all follow-up visits as told by your child's health care provider. This is important. How is this prevented?  Keep your child's vaccinations up to date. Make sure your child gets all recommended vaccinations, including a pneumonia and flu vaccine.  Encourage hand washing. Your child should wash his or her hands often with soap and water. If there is no soap and water, he or she should use hand sanitizer.  Avoid exposing your child to  tobacco smoke.  Breastfeed your baby, if possible. Babies who are breastfed as long as possible are less likely to develop this condition. Contact a health care provider if:  Your child's hearing does not get better after 3 months.  Your child's hearing is worse.  Your child has ear pain.  Your child has a fever.  Your child has drainage from the ear.  Your child is dizzy.  Your child has a lump on his or her neck. Get help right away if:  Your child has bleeding from the nose.  Your child cannot move part of her or his face.  Your child has trouble breathing.  Your child cannot smell.  Your child develops severe congestion.  Your child develops weakness.  Your child who is younger than 3 months has a temperature of 100F (38C) or higher. Summary  Otitis media with  effusion (OME) occurs when there is inflammation of the middle ear and fluid in the middle ear space.  This condition is caused by blockage of one or both eustachian tubes, which drain fluid in the ears to the back of the nose.  Symptoms of this condition can include temporary hearing loss, a feeling of fullness in the ear, irritability or agitation, and balance (vestibular) problems. Sometimes, there are no symptoms.  This condition is diagnosed with an ear exam and tests, such as pneumatic otoscopy, tympanogram, and audiogram.  Treatment for this condition depends on the cause. In many cases, the fluid goes away on its own. This information is not intended to replace advice given to you by your health care provider. Make sure you discuss any questions you have with your health care provider. Document Released: 03/29/2003 Document Revised: 11/29/2015 Document Reviewed: 11/29/2015 Elsevier Interactive Patient Education  2017 [employer]. Sinus Rinse What is a sinus rinse? A sinus rinse is a simple home treatment that is used to rinse your sinuses with a sterile mixture of salt and water (saline solution). Sinuses are air-filled spaces in your skull behind the bones of your face and forehead that open into your nasal cavity. You will use the following:  Saline solution.  Neti pot or spray bottle. This releases the saline solution into your nose and through your sinuses. Neti pots and spray bottles can be purchased at [employer], a health food store, or online.  When would I do a sinus rinse? A sinus rinse can help to clear mucus, dirt, dust, or pollen from the nasal cavity. You may do a sinus rinse when you have a cold, a virus, nasal allergy symptoms, a sinus infection, or stuffiness in the nose or sinuses. If you are considering a sinus rinse:  Ask your child's health care provider before performing a sinus rinse on your child.  Do not do a sinus rinse if you have had ear or  nasal surgery, ear infection, or blocked ears.  How do I do a sinus rinse?  Wash your hands.  Disinfect your device according to the directions provided and then dry it.  Use the solution that comes with your device or one that is sold separately in stores. Follow the mixing directions on the package.  Fill your device with the amount of saline solution as directed by the device instructions.  Stand over a sink and tilt your head sideways over the sink.  Place the spout of the device in your upper nostril (the one closer to the ceiling).  Gently pour or squeeze the saline solution into  the nasal cavity. The liquid should drain to the lower nostril if you are not overly congested.  Gently blow your nose. Blowing too hard may cause ear pain.  Repeat in the other nostril.  Clean and rinse your device with clean water and then air-dry it. Are there risks of a sinus rinse? Sinus rinse is generally very safe and effective. However, there are a few risks, which include:  A burning sensation in the sinuses. This may happen if you do not make the saline solution as directed. Make sure to follow all directions when making the saline solution.  Infection from contaminated water. This is rare, but possible.  Nasal irritation.  This information is not intended to replace advice given to you by your health care provider. Make sure you discuss any questions you have with your health care provider. Document Released: 08/03/2013 Document Revised: 12/04/2015 Document Reviewed: 05/24/2013 Elsevier Interactive Patient Education  2017 [employer].

## 2016-09-02 NOTE — Progress Notes (Signed)
[facility] insurance premium discount evaluation: Labs Drawn. Replacements ROI form signed. Tobacco Free Attestation form signed.  Forms placed in paper chart.  No specific PCP currently. Seeing [facility] but her MD left practice and they are currently setting her up with a new to the practice MD. She has annual physical appt in Oct. Plans to just take hard copy of results to that appt.

## 2016-09-02 NOTE — Progress Notes (Signed)
Subjective:    Patient ID: Madison Garner, female    DOB: 1970-04-28, 46 y.o.   MRN: [id number]  45y/o caucasian married established female here for enlarged tonsils x 7 days and ear pain x 24 hours.  Denied fever/chills/nausea/vomiting/headache.  Seasonal allergies controlled with flonase and allegra year round this year.  "My tongue hitting on tonsils when I swallow" but  Denied changes in voice, difficulty swallowing/eating, headache, nausea, vomiting, sob, dyspnea or sick contacts.  Motrin po prn  Last augmentin Apr 2018.      Review of Systems  Constitutional: Negative for activity change, appetite change, chills, diaphoresis, fatigue, fever and unexpected weight change.  HENT: Positive for congestion, ear pain and sore throat. Negative for dental problem, drooling, ear discharge, facial swelling, hearing loss, mouth sores, postnasal drip, rhinorrhea, sinus pain, sinus pressure, sneezing, tinnitus, trouble swallowing and voice change.   Eyes: Negative for photophobia, pain, discharge, redness, itching and visual disturbance.  Respiratory: Negative for cough, choking, chest tightness, shortness of breath, wheezing and stridor.   Cardiovascular: Negative for chest pain, palpitations and leg swelling.  Gastrointestinal: Negative for abdominal distention, abdominal pain, blood in stool, constipation, diarrhea, nausea and vomiting.  Endocrine: Negative for cold intolerance and heat intolerance.  Genitourinary: Negative for difficulty urinating, dysuria and hematuria.  Musculoskeletal: Negative for arthralgias, back pain, gait problem, joint swelling, myalgias, neck pain and neck stiffness.  Skin: Negative for color change, pallor, rash and wound.  Allergic/Immunologic: Positive for environmental allergies. Negative for food allergies.  Neurological: Negative for dizziness, tremors, seizures, syncope, facial asymmetry, speech difficulty, weakness, light-headedness, numbness and headaches.   Hematological: Negative for adenopathy. Does not bruise/bleed easily.  Psychiatric/Behavioral: Negative for agitation, behavioral problems, confusion and sleep disturbance.       Objective:   Physical Exam  Constitutional: She is oriented to person, place, and time. Vital signs are normal. She appears well-developed and well-nourished. She is active and cooperative.  Non-toxic appearance. She does not have a sickly appearance. She does not appear ill. No distress.  HENT:  Head: Normocephalic and atraumatic.  Right Ear: Hearing, external ear and ear canal normal. A middle ear effusion is present.  Left Ear: Hearing, external ear and ear canal normal. A middle ear effusion is present.  Nose: Mucosal edema and rhinorrhea present. No nose lacerations, sinus tenderness, nasal deformity, septal deviation or nasal septal hematoma. No epistaxis.  No foreign bodies. Right sinus exhibits no maxillary sinus tenderness and no frontal sinus tenderness. Left sinus exhibits no maxillary sinus tenderness and no frontal sinus tenderness.  Mouth/Throat: Uvula is midline and mucous membranes are normal. Mucous membranes are not pale, not dry and not cyanotic. She does not have dentures. No oral lesions. No trismus in the jaw. Normal dentition. No dental abscesses, uvula swelling, lacerations or dental caries. Posterior oropharyngeal edema and posterior oropharyngeal erythema present. No oropharyngeal exudate or tonsillar abscesses.  Bilateral allergic shiners; bilateral TMs air fluid level right 10% opacity centrally and left 100% clear; tonsils 3+/4 edema/erythema no exudate; cobblestoning posterior pharynx; nasal turbinates edema/erythema clear discharge  Eyes: Pupils are equal, round, and reactive to light. Conjunctivae, EOM and lids are normal. Right eye exhibits no chemosis, no discharge, no exudate and no hordeolum. No foreign body present in the right eye. Left eye exhibits no chemosis, no discharge, no exudate  and no hordeolum. No foreign body present in the left eye. Right conjunctiva is not injected. Right conjunctiva has no hemorrhage. Left conjunctiva is not injected. Left conjunctiva  has no hemorrhage. No scleral icterus. Right eye exhibits normal extraocular motion and no nystagmus. Left eye exhibits normal extraocular motion and no nystagmus. Right pupil is round and reactive. Left pupil is round and reactive. Pupils are equal.  Neck: Trachea normal, normal range of motion and phonation normal. Neck supple. No tracheal tenderness, no spinous process tenderness and no muscular tenderness present. No neck rigidity. No tracheal deviation, no edema, no erythema and normal range of motion present. No thyroid mass and no thyromegaly present.  Cardiovascular: Normal rate, regular rhythm, S1 normal, S2 normal, normal heart sounds and intact distal pulses.  PMI is not displaced.  Exam reveals no gallop and no friction rub.   No murmur heard. Pulmonary/Chest: Effort normal and breath sounds normal. No accessory muscle usage or stridor. No respiratory distress. She has no decreased breath sounds. She has no wheezes. She has no rhonchi. She has no rales. She exhibits no tenderness.  Speaks full sentences without difficulty; no cough observed in exam room  Abdominal: Soft. Normal appearance. She exhibits no distension and no ascites. There is no guarding.  Musculoskeletal: Normal range of motion. She exhibits no edema or tenderness.       Right shoulder: Normal.       Left shoulder: Normal.       Right elbow: Normal.      Left elbow: Normal.       Right hip: Normal.       Left hip: Normal.       Right knee: Normal.       Left knee: Normal.       Cervical back: Normal.       Right hand: Normal.       Left hand: Normal.  Lymphadenopathy:       Head (right side): No submental, no submandibular, no tonsillar, no preauricular, no posterior auricular and no occipital adenopathy present.       Head (left side): No  submental, no submandibular, no tonsillar, no preauricular, no posterior auricular and no occipital adenopathy present.    She has no cervical adenopathy.       Right cervical: No superficial cervical, no deep cervical and no posterior cervical adenopathy present.      Left cervical: No superficial cervical, no deep cervical and no posterior cervical adenopathy present.  Neurological: She is alert and oriented to person, place, and time. She has normal strength. She is not disoriented. She displays no atrophy and no tremor. No cranial nerve deficit or sensory deficit. She exhibits normal muscle tone. She displays no seizure activity. Coordination and gait normal. GCS eye subscore is 4. GCS verbal subscore is 5. GCS motor subscore is 6.  On/off exam table; in/out of chair without difficulty; gait sure and steady hall  Skin: Skin is warm, dry and intact. No abrasion, no bruising, no burn, no ecchymosis, no laceration, no lesion, no petechiae and no rash noted. She is not diaphoretic. No cyanosis or erythema. No pallor. Nails show no clubbing.  Psychiatric: She has a normal mood and affect. Her speech is normal and behavior is normal. Judgment and thought content normal. Cognition and memory are normal.  Nursing note and vitals reviewed.         Assessment & Plan:  A-acute pharyngitis; bilateral otitis media effusion; seasonal allergic rhinitis  P-amoxicillin 875mg  po BID x 10 days #20 RF0 dispensed from [facility]. Nasal saline 2 sprays each nostril q2h wa 1 bottle dispensed from clinic stock; motrin 800mg  po  TID prn pain/swelling; if no improvement follow up 48 hours will start prednisone short course as discussed with patient if this is viral it will not respond to antibiotics.  Continue antihistamines, nasal steroid and shower BID.  Usually no specific medical treatment is needed if a virus is causing the sore throat.  The throat most often gets better on its own within 5 to 7 days.  Antibiotic medicine  does not cure viral pharyngitis.   For acute pharyngitis caused by bacteria, your healthcare provider will prescribe an antibiotic.  Marland Kitchen Do not smoke.  Marland Kitchen Avoid secondhand smoke and other air pollutants.  . Use a cool mist humidifier to add moisture to the air.  . Get plenty of rest.  . You may want to rest your throat by talking less and eating a diet that is mostly liquid or soft for a day or two.   Marland Kitchen Nonprescription throat lozenges and mouthwashes should help relieve the soreness.   . Gargling with warm saltwater and drinking warm liquids may help.  (You can make a saltwater solution by adding 1/4 teaspoon of salt to 8 ounces, or 240 mL, of warm water.)  . A nonprescription pain reliever such as aspirin, acetaminophen, or ibuprofen may ease general aches and pains.    Supportive treatment.   No evidence of invasive bacterial infection, non toxic and well hydrated.  This is most likely self limiting viral infection.  I do not see where any further testing or imaging is necessary at this time.   I will suggest supportive care, rest, good hygiene and encourage the patient to take adequate fluids.  The patient is to return to clinic or EMERGENCY ROOM if symptoms worsen or change significantly e.g. ear pain, fever, purulent discharge from ears or bleeding.  Exitcare handout on otitis media with effusion given to patient.  Patient verbalized agreement and understanding of treatment plan.    Patient may use normal saline nasal spray as needed.  Consider antihistamine or nasal steroid use.  Avoid triggers if possible.  Shower prior to bedtime if exposed to triggers.  If allergic dust/dust mites recommend mattress/pillow covers/encasements; washing linens, vacuuming, sweeping, dusting weekly.  Call or return to clinic as needed if these symptoms worsen or fail to improve as anticipated.   Exitcare handout on allergic rhinitis given to patient.  Patient verbalized understanding of instructions, agreed with plan  of care and had no further questions at this time.  P2:  Avoidance and hand washing.

## 2016-09-03 LAB — CMP12+LP+TP+TSH+6AC+CBC/D/PLT
A/G RATIO: 1.6 (ref 1.2–2.2)
ALBUMIN: 4.3 g/dL (ref 3.5–5.5)
ALT: 13 IU/L (ref 0–32)
AST: 15 IU/L (ref 0–40)
Alkaline Phosphatase: 54 IU/L (ref 39–117)
BASOS ABS: 0 10*3/uL (ref 0.0–0.2)
BUN / CREAT RATIO: 17 (ref 9–23)
BUN: 12 mg/dL (ref 6–24)
Basos: 1 %
Bilirubin Total: 0.4 mg/dL (ref 0.0–1.2)
CHOLESTEROL TOTAL: 227 mg/dL — AB (ref 100–199)
Calcium: 9.1 mg/dL (ref 8.7–10.2)
Chloride: 103 mmol/L (ref 96–106)
Chol/HDL Ratio: 3.1 ratio (ref 0.0–4.4)
Creatinine, Ser: 0.7 mg/dL (ref 0.57–1.00)
EOS (ABSOLUTE): 0.1 10*3/uL (ref 0.0–0.4)
Eos: 1 %
FREE THYROXINE INDEX: 1.5 (ref 1.2–4.9)
GFR calc Af Amer: 122 mL/min/{1.73_m2} (ref >59)
GFR calc non Af Amer: 106 mL/min/{1.73_m2} (ref >59)
GGT: 4 IU/L (ref 0–60)
Globulin, Total: 2.7 g/dL (ref 1.5–4.5)
Glucose: 72 mg/dL (ref 65–99)
HDL: 73 mg/dL (ref >39)
HEMOGLOBIN: 13.1 g/dL (ref 11.1–15.9)
Hematocrit: 38.7 % (ref 34.0–46.6)
IMMATURE GRANULOCYTES: 0 %
Immature Grans (Abs): 0 10*3/uL (ref 0.0–0.1)
Iron: 118 ug/dL (ref 27–159)
LDH: 170 IU/L (ref 119–226)
LDL Calculated: 138 mg/dL — ABNORMAL HIGH (ref 0–99)
Lymphocytes Absolute: 1.9 10*3/uL (ref 0.7–3.1)
Lymphs: 30 %
MCH: 30.2 pg (ref 26.6–33.0)
MCHC: 33.9 g/dL (ref 31.5–35.7)
MCV: 89 fL (ref 79–97)
Monocytes Absolute: 0.4 10*3/uL (ref 0.1–0.9)
Monocytes: 6 %
NEUTROS PCT: 62 %
Neutrophils Absolute: 4.1 10*3/uL (ref 1.4–7.0)
PLATELETS: 329 10*3/uL (ref 150–379)
Phosphorus: 3.2 mg/dL (ref 2.5–4.5)
Potassium: 4.6 mmol/L (ref 3.5–5.2)
RBC: 4.34 x10E6/uL (ref 3.77–5.28)
RDW: 13.3 % (ref 12.3–15.4)
Sodium: 139 mmol/L (ref 134–144)
T3 UPTAKE RATIO: 24 % (ref 24–39)
T4 TOTAL: 6.3 ug/dL (ref 4.5–12.0)
TOTAL PROTEIN: 7 g/dL (ref 6.0–8.5)
TSH: 2.69 u[IU]/mL (ref 0.450–4.500)
Triglycerides: 78 mg/dL (ref 0–149)
Uric Acid: 4 mg/dL (ref 2.5–7.1)
VLDL CHOLESTEROL CAL: 16 mg/dL (ref 5–40)
WBC: 6.5 10*3/uL (ref 3.4–10.8)

## 2016-09-03 LAB — HGB A1C W/O EAG: HEMOGLOBIN A1C: 4.8 % (ref 4.8–5.6)

## 2016-09-04 NOTE — Progress Notes (Signed)
Results reviewed with pt. Total cholesterol elevated, worsened from previous. LDL elevated, slightly improved from previous. All other labs WNL. 35# and 7 point BMI drop since [facility] last year. Congratulated pt on improvements and encouraged continuance to healthy BMI of 25. Copy provided to pt. Waiting on practice to establish new pcp. Will take copy of labs to next appt.

## 2016-09-25 ENCOUNTER — Ambulatory Visit: Payer: Self-pay | Admitting: Registered Nurse

## 2016-09-25 VITALS — BP 125/78 | HR 53 | Temp 98.1°F

## 2016-09-25 DIAGNOSIS — M722 Plantar fascial fibromatosis: Secondary | ICD-10-CM

## 2016-09-25 DIAGNOSIS — J301 Allergic rhinitis due to pollen: Secondary | ICD-10-CM

## 2016-09-25 DIAGNOSIS — R2241 Localized swelling, mass and lump, right lower limb: Secondary | ICD-10-CM

## 2016-09-25 MED ORDER — NAPROXEN SODIUM 220 MG PO TABS: 220.0000 mg | ORAL_TABLET | Freq: Two times a day (BID) | ORAL | 0 refills | Status: AC

## 2016-09-25 NOTE — Progress Notes (Signed)
Subjective:    Patient ID: Madison Garner, female    DOB: [birth year], 46 y.o.   MRN: [id number]  46y/o caucasian established patient reports R foot pain. Hx plantar fascitis. But states this pain is different and hurt with massage prior to getting out of bed this morning. Noticed change this am upon standing. Pain is more on the lateral aspect of bilateral feet right greater than left and not relieved as well with walking as her plantar fascitis usually is. Usual exercises (calf/toe stretches) not relieving pain. Also has noticed a quarter sized "knot" in the arch of her R foot. Not very painful but is irritating and can feel that it is there.   Has started wearing an older pair [employer] sneakers with inserts recently for at work.  Has new shoes for exercise at home.  Has been on exercise/weight loss program generally running for exercise on street.  Last run this weekend 6 miles.  Has not been icing recently.  Using aleve prn not every day for pain.  Patient also needs a refill on her flonase nasal spray working well for her.       Review of Systems  Constitutional: Negative for activity change, appetite change, chills, diaphoresis, fatigue, fever and unexpected weight change.  HENT: Positive for postnasal drip and rhinorrhea. Negative for congestion, dental problem, drooling, ear discharge, ear pain, facial swelling, hearing loss, mouth sores, nosebleeds, sinus pain, sinus pressure, sneezing, sore throat, tinnitus, trouble swallowing and voice change.   Eyes: Negative for photophobia, pain, discharge, redness, itching and visual disturbance.  Respiratory: Negative for cough, choking, chest tightness, shortness of breath, wheezing and stridor.   Cardiovascular: Negative for chest pain, palpitations and leg swelling.  Gastrointestinal: Negative for abdominal distention, abdominal pain, blood in stool, constipation, diarrhea, nausea and vomiting.  Endocrine: Negative for cold intolerance and heat  intolerance.  Genitourinary: Negative for difficulty urinating, dysuria and hematuria.  Musculoskeletal: Positive for myalgias. Negative for arthralgias, back pain, gait problem, joint swelling, neck pain and neck stiffness.  Skin: Negative for color change, pallor, rash and wound.  Allergic/Immunologic: Positive for environmental allergies. Negative for food allergies.  Neurological: Negative for dizziness, tremors, seizures, syncope, facial asymmetry, speech difficulty, weakness, light-headedness, numbness and headaches.  Hematological: Negative for adenopathy. Does not bruise/bleed easily.  Psychiatric/Behavioral: Negative for agitation, behavioral problems, confusion and sleep disturbance.       Objective:   Physical Exam  Constitutional: She is oriented to person, place, and time. Vital signs are normal. She appears well-developed and well-nourished. She is active and cooperative.  Non-toxic appearance. She does not have a sickly appearance. She does not appear ill. No distress.  HENT:  Head: Normocephalic and atraumatic.  Right Ear: Hearing and external ear normal.  Left Ear: Hearing and external ear normal.  Nose: Mucosal edema and rhinorrhea present. No nose lacerations, nasal deformity, septal deviation or nasal septal hematoma. No epistaxis.  No foreign bodies.  Mouth/Throat: Uvula is midline and mucous membranes are normal. Mucous membranes are not pale, not dry and not cyanotic. She does not have dentures. No oral lesions. No trismus in the jaw. Normal dentition. No dental abscesses, uvula swelling, lacerations or dental caries. Posterior oropharyngeal edema and posterior oropharyngeal erythema present. No oropharyngeal exudate or tonsillar abscesses.  Cobblestoning posterior pharynx; bilateral nasal turbinates edema/erythema clear discharge; bilateral allergic shiners  Eyes: Pupils are equal, round, and reactive to light. Conjunctivae, EOM and lids are normal. Right eye exhibits no  chemosis, no discharge, no exudate  and no hordeolum. No foreign body present in the right eye. Left eye exhibits no chemosis, no discharge, no exudate and no hordeolum. No foreign body present in the left eye. Right conjunctiva is not injected. Right conjunctiva has no hemorrhage. Left conjunctiva is not injected. Left conjunctiva has no hemorrhage. No scleral icterus. Right eye exhibits normal extraocular motion and no nystagmus. Left eye exhibits normal extraocular motion and no nystagmus. Right pupil is round and reactive. Left pupil is round and reactive. Pupils are equal.  Neck: Trachea normal, normal range of motion and phonation normal. Neck supple. No tracheal tenderness, no spinous process tenderness and no muscular tenderness present. No neck rigidity. No tracheal deviation, no edema, no erythema and normal range of motion present. No thyroid mass and no thyromegaly present.  Cardiovascular: Normal rate, regular rhythm, S1 normal, S2 normal, normal heart sounds and intact distal pulses.  PMI is not displaced.  Exam reveals no gallop and no friction rub.   No murmur heard. Pulses:      Dorsalis pedis pulses are 2+ on the right side, and 2+ on the left side.       Posterior tibial pulses are 2+ on the right side, and 2+ on the left side.  Pulmonary/Chest: Effort normal and breath sounds normal. No accessory muscle usage or stridor. No respiratory distress. She has no decreased breath sounds. She has no wheezes. She has no rhonchi. She has no rales. She exhibits no tenderness.  No cough observed in exam room; spoke full sentences without difficulty  Abdominal: Soft. Normal appearance. She exhibits no distension, no fluid wave and no ascites. There is no rigidity and no guarding.  Musculoskeletal: Normal range of motion. She exhibits no edema.       Right shoulder: Normal.       Left shoulder: Normal.       Right hip: Normal.       Left hip: Normal.       Right knee: Normal.       Left knee:  Normal.       Right ankle: She exhibits normal range of motion, no swelling, no ecchymosis, no deformity, no laceration and normal pulse. Tenderness. Head of 5th metatarsal tenderness found. No lateral malleolus, no medial malleolus and no proximal fibula tenderness found. Achilles tendon exhibits pain. Achilles tendon exhibits no defect.       Left ankle: She exhibits normal range of motion, no swelling, no ecchymosis, no deformity, no laceration and normal pulse. Tenderness. Head of 5th metatarsal tenderness found. No proximal fibula tenderness found. Achilles tendon exhibits pain. Achilles tendon exhibits no defect.       Cervical back: Normal.       Right hand: Normal.       Left hand: Normal.       Right lower leg: Normal.       Left lower leg: Normal.       Right foot: There is tenderness and swelling. There is normal range of motion, no bony tenderness, normal capillary refill, no crepitus, no deformity and no laceration.       Left foot: There is tenderness. There is normal range of motion, no bony tenderness, no swelling, normal capillary refill, no crepitus, no deformity and no laceration.       Feet:  Lymphadenopathy:       Head (right side): No submental, no submandibular, no tonsillar, no preauricular, no posterior auricular and no occipital adenopathy present.  Head (left side): No submental, no submandibular, no tonsillar, no preauricular, no posterior auricular and no occipital adenopathy present.    She has no cervical adenopathy.       Right cervical: No superficial cervical, no deep cervical and no posterior cervical adenopathy present.      Left cervical: No superficial cervical, no deep cervical and no posterior cervical adenopathy present.  Neurological: She is alert and oriented to person, place, and time. She has normal strength. She is not disoriented. She displays no atrophy and no tremor. No cranial nerve deficit or sensory deficit. She exhibits normal muscle tone.  She displays no seizure activity. Coordination and gait normal. GCS eye subscore is 4. GCS verbal subscore is 5. GCS motor subscore is 6.  In/out of chair; on/off exam table without difficulty gait sure and steady hall  Skin: Skin is warm, dry and intact. No abrasion, no bruising, no burn, no ecchymosis, no laceration, no lesion, no petechiae and no rash noted. She is not diaphoretic. No cyanosis or erythema. No pallor. Nails show no clubbing.  Psychiatric: She has a normal mood and affect. Her speech is normal and behavior is normal. Judgment and thought content normal. Cognition and memory are normal.  Nursing note and vitals reviewed.         Assessment & Plan:  A-bilateral plantar fasciiitis, right plantar foot lump and allergic rhinitis seasonal due to pollen  P-Patient will use aleve at home discussed 220-440mg  po BID x 2 weeks; cryotherapy 15 minutes TID frozen water bottle bilateral feet; new shoes for work as treads worn off.  Given 1 pair heel cups from clinic stock to use in current work shoes until new shoes can be bought.  When exercising run on soft surface eg treadmill, grass, padded track.  Continue stretches, weight loss.  Consider podiatry referral if lump does not resolve with plan of care and/or plantar fasciitis worsening.  Discussed with patient worn or ill fitting shoes could cause rub spots aka lump (swelling/irritation/pain) ensure OTC orthotics/shoes fitted correctly.  Consider compression sock use also.Patient given [employer] handout on plantar fasciitis with rehab exercises. Discussed stretches and icing. Follow up with [facility] if no improvement with discussed care for podiatry referral. Start NSAID of choice (taken previously without side effects). Consider new supportive footwear/OTC inserts if shoe treads worn out/greater than 500 miles. Patient verbalized understanding of instructions, agreed with plan of care and had no further questions at this time.   Refilled flonase  nasal spray sig 1 spray each nostril BID #1 RF0 from [facility]  Patient may use normal saline nasal spray as needed. Consider antihistamine or nasal steroid use. Avoid triggers if possible. Shower prior to bedtime if exposed to triggers. If allergic dust/dust mites recommend mattress/pillow covers/encasements; washing linens, vacuuming, sweeping, dusting weekly. Call or return to clinic as needed if these symptoms worsen or fail to improve as anticipated. [employer] handout on allergic rhinitis given to patient. Patient verbalized understanding of instructions, agreed with plan of care and had no further questions at this time.  P2: Avoidance and hand washing.

## 2016-09-25 NOTE — Patient Instructions (Signed)
Ice bilateral feet with frozen water bottle at least nightly 15 minutes three times per day preferred Continue stretches feet/legs Replace worn shoes Run on soft surfaces x 2 weeks e.g. Padded track, treadmill and/or grass If no improvement consider podiatry referral from [facility] Aleve 220-440mg  by mouth every 12 hours x 2 weeks Flonase 1 spray each nostril every 12 hours Nasal saline 2 sprays each nostril every 2 hours while awake as needed for congestion  Sinus Rinse What is a sinus rinse? A sinus rinse is a simple home treatment that is used to rinse your sinuses with a sterile mixture of salt and water (saline solution). Sinuses are air-filled spaces in your skull behind the bones of your face and forehead that open into your nasal cavity. You will use the following:  Saline solution.  Neti pot or spray bottle. This releases the saline solution into your nose and through your sinuses. Neti pots and spray bottles can be purchased at [employer], a health food store, or online.  When would I do a sinus rinse? A sinus rinse can help to clear mucus, dirt, dust, or pollen from the nasal cavity. You may do a sinus rinse when you have a cold, a virus, nasal allergy symptoms, a sinus infection, or stuffiness in the nose or sinuses. If you are considering a sinus rinse:  Ask your child's health care provider before performing a sinus rinse on your child.  Do not do a sinus rinse if you have had ear or nasal surgery, ear infection, or blocked ears.  How do I do a sinus rinse?  Wash your hands.  Disinfect your device according to the directions provided and then dry it.  Use the solution that comes with your device or one that is sold separately in stores. Follow the mixing directions on the package.  Fill your device with the amount of saline solution as directed by the device instructions.  Stand over a sink and tilt your head sideways over the sink.  Place the spout of the  device in your upper nostril (the one closer to the ceiling).  Gently pour or squeeze the saline solution into the nasal cavity. The liquid should drain to the lower nostril if you are not overly congested.  Gently blow your nose. Blowing too hard may cause ear pain.  Repeat in the other nostril.  Clean and rinse your device with clean water and then air-dry it. Are there risks of a sinus rinse? Sinus rinse is generally very safe and effective. However, there are a few risks, which include:  A burning sensation in the sinuses. This may happen if you do not make the saline solution as directed. Make sure to follow all directions when making the saline solution.  Infection from contaminated water. This is rare, but possible.  Nasal irritation.  This information is not intended to replace advice given to you by your health care provider. Make sure you discuss any questions you have with your health care provider. Document Released: 08/03/2013 Document Revised: 12/04/2015 Document Reviewed: 05/24/2013 Elsevier Interactive Patient Education  2017 [employer] Allergic Rhinitis Allergic rhinitis is when the mucous membranes in the nose respond to allergens. Allergens are particles in the air that cause your body to have an allergic reaction. This causes you to release allergic antibodies. Through a chain of events, these eventually cause you to release histamine into the blood stream. Although meant to protect the body, it is this release of histamine that  causes your discomfort, such as frequent sneezing, congestion, and an itchy, runny nose. What are the causes? Seasonal allergic rhinitis (hay fever) is caused by pollen allergens that may come from grasses, trees, and weeds. Year-round allergic rhinitis (perennial allergic rhinitis) is caused by allergens such as house dust mites, pet dander, and mold spores. What are the signs or symptoms?  Nasal stuffiness (congestion).  Itchy, runny  nose with sneezing and tearing of the eyes. How is this diagnosed? Your health care provider can help you determine the allergen or allergens that trigger your symptoms. If you and your health care provider are unable to determine the allergen, skin or blood testing may be used. Your health care provider will diagnose your condition after taking your health history and performing a physical exam. Your health care provider may assess you for other related conditions, such as asthma, pink eye, or an ear infection. How is this treated? Allergic rhinitis does not have a cure, but it can be controlled by:  Medicines that block allergy symptoms. These may include allergy shots, nasal sprays, and oral antihistamines.  Avoiding the allergen.  Hay fever may often be treated with antihistamines in pill or nasal spray forms. Antihistamines block the effects of histamine. There are over-the-counter medicines that may help with nasal congestion and swelling around the eyes. Check with your health care provider before taking or giving this medicine. If avoiding the allergen or the medicine prescribed do not work, there are many new medicines your health care provider can prescribe. Stronger medicine may be used if initial measures are ineffective. Desensitizing injections can be used if medicine and avoidance does not work. Desensitization is when a patient is given ongoing shots until the body becomes less sensitive to the allergen. Make sure you follow up with your health care provider if problems continue. Follow these instructions at home: It is not possible to completely avoid allergens, but you can reduce your symptoms by taking steps to limit your exposure to them. It helps to know exactly what you are allergic to so that you can avoid your specific triggers. Contact a health care provider if:  You have a fever.  You develop a cough that does not stop easily (persistent).  You have shortness of  breath.  You start wheezing.  Symptoms interfere with normal daily activities. This information is not intended to replace advice given to you by your health care provider. Make sure you discuss any questions you have with your health care provider. Document Released: 10/01/2000 Document Revised: 09/07/2015 Document Reviewed: 09/13/2012 Elsevier Interactive Patient Education  2017 [employer] Plantar Fasciitis Rehab Ask your health care provider which exercises are safe for you. Do exercises exactly as told by your health care provider and adjust them as directed. It is normal to feel mild stretching, pulling, tightness, or discomfort as you do these exercises, but you should stop right away if you feel sudden pain or your pain gets worse. Do not begin these exercises until told by your health care provider. Stretching and range of motion exercises These exercises warm up your muscles and joints and improve the movement and flexibility of your foot. These exercises also help to relieve pain. Exercise A: Plantar fascia stretch  1. Sit with your left / right leg crossed over your opposite knee. 2. Hold your heel with one hand with that thumb near your arch. With your other hand, hold your toes and gently pull them back toward the top of your foot.  You should feel a stretch on the bottom of your toes or your foot or both. 3. Hold this stretch for__________ seconds. 4. Slowly release your toes and return to the starting position. Repeat __________ times. Complete this exercise __________ times a day. Exercise B: Gastroc, standing  1. Stand with your hands against a wall. 2. Extend your left / right leg behind you, and bend your front knee slightly. 3. Keeping your heels on the floor and keeping your back knee straight, shift your weight toward the wall without arching your back. You should feel a gentle stretch in your left / right calf. 4. Hold this position for __________ seconds. Repeat  __________ times. Complete this exercise __________ times a day. Exercise C: Soleus, standing 1. Stand with your hands against a wall. 2. Extend your left / right leg behind you, and bend your front knee slightly. 3. Keeping your heels on the floor, bend your back knee and slightly shift your weight over the back leg. You should feel a gentle stretch deep in your calf. 4. Hold this position for __________ seconds. Repeat __________ times. Complete this exercise __________ times a day. Exercise D: Gastrocsoleus, standing 1. Stand with the ball of your left / right foot on a step. The ball of your foot is on the walking surface, right under your toes. 2. Keep your other foot firmly on the same step. 3. Hold onto the wall or a railing for balance. 4. Slowly lift your other foot, allowing your body weight to press your heel down over the edge of the step. You should feel a stretch in your left / right calf. 5. Hold this position for __________ seconds. 6. Return both feet to the step. 7. Repeat this exercise with a slight bend in your left / right knee. Repeat __________ times with your left / right knee straight and __________ times with your left / right knee bent. Complete this exercise __________ times a day. Balance exercise This exercise builds your balance and strength control of your arch to help take pressure off your plantar fascia. Exercise E: Single leg stand 1. Without shoes, stand near a railing or in a doorway. You may hold onto the railing or door frame as needed. 2. Stand on your left / right foot. Keep your big toe down on the floor and try to keep your arch lifted. Do not let your foot roll inward. 3. Hold this position for __________ seconds. 4. If this exercise is too easy, you can try it with your eyes closed or while standing on a pillow. Repeat __________ times. Complete this exercise __________ times a day. This information is not intended to replace advice given to you  by your health care provider. Make sure you discuss any questions you have with your health care provider. Document Released: 01/06/2005 Document Revised: 09/11/2015 Document Reviewed: 11/20/2014 Elsevier Interactive Patient Education  2018 [employer].

## 2016-11-17 ENCOUNTER — Other Ambulatory Visit: Payer: Self-pay | Admitting: Family Medicine

## 2016-11-17 DIAGNOSIS — Z1231 Encounter for screening mammogram for malignant neoplasm of breast: Secondary | ICD-10-CM

## 2016-11-20 ENCOUNTER — Ambulatory Visit
Admission: RE | Admit: 2016-11-20 | Discharge: 2016-11-20 | Disposition: A | Discharge status: Home / Self Care | Payer: PRIVATE HEALTH INSURANCE | Source: Ambulatory Visit | Attending: Family Medicine | Admitting: Family Medicine

## 2016-11-20 DIAGNOSIS — Z1231 Encounter for screening mammogram for malignant neoplasm of breast: Secondary | ICD-10-CM | POA: Diagnosis present

## 2016-12-18 ENCOUNTER — Ambulatory Visit: Payer: Self-pay | Admitting: Registered Nurse

## 2016-12-18 VITALS — BP 115/86 | HR 66 | Temp 97.9°F

## 2016-12-18 DIAGNOSIS — H6983 Other specified disorders of Eustachian tube, bilateral: Secondary | ICD-10-CM

## 2016-12-18 DIAGNOSIS — J209 Acute bronchitis, unspecified: Secondary | ICD-10-CM

## 2016-12-18 DIAGNOSIS — J019 Acute sinusitis, unspecified: Secondary | ICD-10-CM

## 2016-12-18 MED ORDER — AMOXICILLIN-POT CLAVULANATE 875-125 MG PO TABS: 1.0000 | ORAL_TABLET | Freq: Two times a day (BID) | ORAL | 0 refills | Status: DC

## 2016-12-18 MED ORDER — BENZONATATE 200 MG PO CAPS: 200.0000 mg | ORAL_CAPSULE | Freq: Two times a day (BID) | ORAL | 0 refills | Status: DC | PRN

## 2016-12-18 MED ORDER — SALINE SPRAY 0.65 % NA SOLN: 2.0000 | NASAL | 0 refills | Status: DC

## 2016-12-18 MED ORDER — PROMETHAZINE HCL 6.25 MG/5ML PO SYRP: 12.5000 mg | ORAL_SOLUTION | Freq: Every evening | ORAL | 0 refills | Status: DC | PRN

## 2016-12-18 MED ORDER — PSEUDOEPHEDRINE HCL 30 MG PO TABS: 30.0000 mg | ORAL_TABLET | ORAL | 0 refills | Status: DC | PRN

## 2016-12-18 MED ORDER — MONTELUKAST SODIUM 10 MG PO TABS: 10.0000 mg | ORAL_TABLET | Freq: Every day | ORAL | 3 refills | Status: DC

## 2016-12-18 NOTE — Progress Notes (Signed)
Subjective:    Patient ID: Madison Garner, female    DOB: 1970-11-29, 46 y.o.   MRN: [id number]  46y/o caucasian female established Pt reports dry cough, PND, bilateral ear pain/pressure and itching x4 days. Denies sinus pain/pressure. Using Allegra, Flonase, Aleve at home. Reports promethazine cough syrup is only cough medicine that has helped her in the past but has run out of 6.25/29ml typically used 1 teaspoon with good relief.  Dried up her nasal drip also.  Has some singulair at home but thinks it is expired and would like refill of sudafed Rx so she can fill with [employer] benefits.  OTC cough syrup not working.  Soup broth helped her throat to feel better last night.  Voice lost overnight.  Headaches daily unchanged for the past couple of months goes away when she sleeps  Prefers not to take steroids due to typically gains weight      Review of Systems  Constitutional: Negative for activity change, appetite change, chills, diaphoresis, fatigue, fever and unexpected weight change.  HENT: Positive for congestion, ear pain, postnasal drip, rhinorrhea, sore throat and voice change. Negative for dental problem, drooling, ear discharge, facial swelling, hearing loss, mouth sores, nosebleeds, sinus pressure, sinus pain, sneezing, tinnitus and trouble swallowing.   Eyes: Negative for photophobia, pain, discharge, redness, itching and visual disturbance.  Respiratory: Positive for cough. Negative for choking, chest tightness, shortness of breath, wheezing and stridor.   Cardiovascular: Negative for chest pain, palpitations and leg swelling.  Gastrointestinal: Negative for abdominal distention, abdominal pain, blood in stool, constipation, diarrhea, nausea and vomiting.  Endocrine: Negative for cold intolerance and heat intolerance.  Genitourinary: Negative for difficulty urinating, dysuria and hematuria.  Musculoskeletal: Negative for arthralgias, back pain, gait problem, joint swelling, myalgias, neck  pain and neck stiffness.  Skin: Negative for color change, pallor, rash and wound.  Allergic/Immunologic: Positive for environmental allergies. Negative for food allergies.  Neurological: Positive for headaches. Negative for dizziness, tremors, seizures, syncope, facial asymmetry, speech difficulty, weakness, light-headedness and numbness.  Hematological: Negative for adenopathy. Does not bruise/bleed easily.  Psychiatric/Behavioral: Positive for sleep disturbance. Negative for agitation, behavioral problems and confusion.       Objective:   Physical Exam  Constitutional: She is oriented to person, place, and time. Vital signs are normal. She appears well-developed and well-nourished. She is active and cooperative.  Non-toxic appearance. She does not have a sickly appearance. She appears ill. No distress.  HENT:  Head: Normocephalic and atraumatic.  Right Ear: Hearing, external ear and ear canal normal. A middle ear effusion is present.  Left Ear: Hearing, external ear and ear canal normal. A middle ear effusion is present.  Nose: Mucosal edema and rhinorrhea present. No nose lacerations, sinus tenderness, nasal deformity, septal deviation or nasal septal hematoma. No epistaxis.  No foreign bodies. Right sinus exhibits no maxillary sinus tenderness and no frontal sinus tenderness. Left sinus exhibits no maxillary sinus tenderness and no frontal sinus tenderness.  Mouth/Throat: Uvula is midline and mucous membranes are normal. Mucous membranes are not pale, not dry and not cyanotic. She does not have dentures. No oral lesions. No trismus in the jaw. Normal dentition. No dental abscesses, uvula swelling, lacerations or dental caries. Posterior oropharyngeal edema and posterior oropharyngeal erythema present. No oropharyngeal exudate or tonsillar abscesses.  Bilateral TMs air fluid level clear; cobblestoning posterior pharynx; oropharynx macular erythema; bilateral nasal turbinates edema/erythema   Eyes: Conjunctivae, EOM and lids are normal. Pupils are equal, round, and reactive to light.  Right eye exhibits no chemosis, no discharge, no exudate and no hordeolum. No foreign body present in the right eye. Left eye exhibits no chemosis, no discharge, no exudate and no hordeolum. No foreign body present in the left eye. Right conjunctiva is not injected. Right conjunctiva has no hemorrhage. Left conjunctiva is not injected. Left conjunctiva has no hemorrhage. No scleral icterus. Right eye exhibits normal extraocular motion and no nystagmus. Left eye exhibits normal extraocular motion and no nystagmus. Right pupil is round and reactive. Left pupil is round and reactive. Pupils are equal.  Neck: Trachea normal and normal range of motion. Neck supple. No tracheal tenderness, no spinous process tenderness and no muscular tenderness present. No neck rigidity. No tracheal deviation, no edema, no erythema and normal range of motion present. No thyroid mass and no thyromegaly present.  Cardiovascular: Normal rate, regular rhythm, S1 normal, S2 normal, normal heart sounds and intact distal pulses. PMI is not displaced. Exam reveals no gallop and no friction rub.  No murmur heard. Pulmonary/Chest: Effort normal and breath sounds normal. No accessory muscle usage or stridor. No respiratory distress. She has no decreased breath sounds. She has no wheezes. She has no rhonchi. She has no rales. She exhibits no tenderness.  Frequent nonproductive cough in exam room; spoke full sentences without difficulty; slightly hoarse voice  Abdominal: Soft. Normal appearance. She exhibits no distension, no fluid wave and no ascites. There is no rigidity and no guarding.  Musculoskeletal: Normal range of motion. She exhibits no edema, tenderness or deformity.       Right shoulder: Normal.       Left shoulder: Normal.       Right elbow: Normal.      Left elbow: Normal.       Right hip: Normal.       Left hip: Normal.        Right knee: Normal.       Left knee: Normal.       Cervical back: Normal.       Right hand: Normal.       Left hand: Normal.  Lymphadenopathy:       Head (right side): No submental, no submandibular, no tonsillar, no preauricular, no posterior auricular and no occipital adenopathy present.       Head (left side): No submental, no submandibular, no tonsillar, no preauricular, no posterior auricular and no occipital adenopathy present.    She has no cervical adenopathy.       Right cervical: No superficial cervical, no deep cervical and no posterior cervical adenopathy present.      Left cervical: No superficial cervical, no deep cervical and no posterior cervical adenopathy present.  Neurological: She is alert and oriented to person, place, and time. She has normal strength. She is not disoriented. She displays no atrophy and no tremor. No cranial nerve deficit or sensory deficit. She exhibits normal muscle tone. She displays no seizure activity. Coordination and gait normal. GCS eye subscore is 4. GCS verbal subscore is 5. GCS motor subscore is 6.  On/off exam table; in/out of chair without difficulty; gait sure and steady in hallway  Skin: Skin is warm, dry and intact. No abrasion, no bruising, no burn, no ecchymosis, no laceration, no lesion, no petechiae and no rash noted. She is not diaphoretic. No cyanosis or erythema. No pallor. Nails show no clubbing.  Psychiatric: She has a normal mood and affect. Her speech is normal and behavior is normal. Judgment and thought content  normal. Cognition and memory are normal.  Nursing note and vitals reviewed.         Assessment & Plan:  A-acute rhinosinusitis; acute bronchitis; eustachian tube dysfunction bilaterally  P-Restart sudafed 30mg  1-2 tabs po q4-6h prn rhinitis #60 RF0 max 240mg  per 24 hours caution may cause drowsiness, insomnia.  Patient has taken previously without difficulty.   singulair electronic Rx to pharmacy of choice 10mg  po qhs  #30 RF3; Patient may use normal saline nasal spray 2 sprays each nostril q2h wa as needed. flonase 1 spray each nostril BID #1 RF0.  Patient denied personal or family history of ENT cancer.  OTC antihistamine of choice allegra 180mg  po daily.  Avoid triggers if possible.  Shower prior to bedtime if exposed to triggers.  If allergic dust/dust mites recommend mattress/pillow covers/encasements; washing linens, vacuuming, sweeping, dusting weekly.  Call or return to clinic as needed if these symptoms worsen or fail to improve as anticipated.   Exitcare handout on  rhinitis and sinus rinse given to patient.  Patient verbalized understanding of instructions, agreed with plan of care and had no further questions at this time.  P2:  Avoidance and hand washing.  Holding prednisone at this time denied wheezing/protracted coughing will hold on albuterol also.  Restart medications to dry up post nasal drip probable cause of cough.  Given 7 UD cough drops take 1 po q2h prn cough from clinic stock.  Continue honey with lemon, soup broths natural cough suppressant.   Rx tessalon pearles 200mg  po TID prn cough and promethazine 6.25mg /58ml take 1-2 teaspoons at bedtime prn cough #120 RF0.  Bronchitis simple, community acquired, may have started as viral (probably respiratory syncytial, parainfluenza, influenza, or adenovirus), but now evidence of acute purulent bronchitis with resultant bronchial edema and mucus formation.  Viruses are the most common cause of bronchial inflammation in otherwise healthy adults with acute bronchitis.  The appearance of sputum is not predictive of whether a bacterial infection is present.  Purulent sputum is most often caused by viral infections.  There are a small portion of those caused by non-viral agents being Mycoplama pneumonia.  Microscopic examination or C&S of sputum in the healthy adult with acute bronchitis is generally not helpful (usually negative or normal respiratory flora)  other considerations being cough from upper respiratory tract infections, sinusitis or allergic syndromes (mild asthma or viral pneumonia).  Differential Diagnoses:  reactive airway disease (asthma, allergic aspergillosis (eosinophilia), chronic bronchitis, respiratory infection (sinusitis, common cold, pneumonia), congestive heart failure, reflux esophagitis, bronchogenic tumor, aspiration syndromes and/or exposure to pulmonary irritants/smoke. Without high fever, severe dyspnea, lack of physical findings or other risk factors, I will hold on a chest radiograph and CBC at this time.  I discussed that approximately 50% of patients with acute bronchitis have a cough that lasts up to three weeks, and 25% for over a month.  Tylenol 500mg  one to two tablets every four to six hours as needed for fever or myalgias.  No aspirin. Exitcare handout on bronchitis and given to patient.  ER if hemopthysis, SOB, worst chest pain of life.   Patient instructed to follow up in one week or sooner if symptoms worsen.  Patient verbalized agreement and understanding of treatment plan.  P2:  hand washing and cover cough  Restart singulair and sudafed see above.  Continue flonase 1 spray each nostril BID, saline 2 sprays each nostril q2h wa prn congestion.  If no improvement with 48 hours with this plan of care  start augmentin 875mg  po BID x 10 days #20 RF0 dispensed to patient from [facility].  Denied personal or family history of ENT cancer.  Shower BID especially prior to bed. No evidence of systemic bacterial infection, non toxic and well hydrated.  I do not see where any further testing or imaging is necessary at this time.   I will suggest supportive care, rest, good hygiene and encourage the patient to take adequate fluids.  The patient is to return to clinic or EMERGENCY ROOM if symptoms worsen or change significantly.  Exitcare handout on sinusitis and sinus rinse given to patient.  Patient verbalized agreement and understanding of  treatment plan and had no further questions at this time.   P2:  Hand washing and cover cough

## 2016-12-19 NOTE — Patient Instructions (Addendum)
Sinus Rinse What is a sinus rinse? A sinus rinse is a simple home treatment that is used to rinse your sinuses with a sterile mixture of salt and water (saline solution). Sinuses are air-filled spaces in your skull behind the bones of your face and forehead that open into your nasal cavity. You will use the following:  Saline solution.  Neti pot or spray bottle. This releases the saline solution into your nose and through your sinuses. Neti pots and spray bottles can be purchased at [employer], a health food store, or online.  When would I do a sinus rinse? A sinus rinse can help to clear mucus, dirt, dust, or pollen from the nasal cavity. You may do a sinus rinse when you have a cold, a virus, nasal allergy symptoms, a sinus infection, or stuffiness in the nose or sinuses. If you are considering a sinus rinse:  Ask your child's health care provider before performing a sinus rinse on your child.  Do not do a sinus rinse if you have had ear or nasal surgery, ear infection, or blocked ears.  How do I do a sinus rinse?  Wash your hands.  Disinfect your device according to the directions provided and then dry it.  Use the solution that comes with your device or one that is sold separately in stores. Follow the mixing directions on the package.  Fill your device with the amount of saline solution as directed by the device instructions.  Stand over a sink and tilt your head sideways over the sink.  Place the spout of the device in your upper nostril (the one closer to the ceiling).  Gently pour or squeeze the saline solution into the nasal cavity. The liquid should drain to the lower nostril if you are not overly congested.  Gently blow your nose. Blowing too hard may cause ear pain.  Repeat in the other nostril.  Clean and rinse your device with clean water and then air-dry it. Are there risks of a sinus rinse? Sinus rinse is generally very safe and effective. However,  there are a few risks, which include:  A burning sensation in the sinuses. This may happen if you do not make the saline solution as directed. Make sure to follow all directions when making the saline solution.  Infection from contaminated water. This is rare, but possible.  Nasal irritation.  This information is not intended to replace advice given to you by your health care provider. Make sure you discuss any questions you have with your health care provider. Document Released: 08/03/2013 Document Revised: 12/04/2015 Document Reviewed: 05/24/2013 Elsevier Interactive Patient Education  2017 [employer] Acute Bronchitis, Adult Acute bronchitis is sudden (acute) swelling of the air tubes (bronchi) in the lungs. Acute bronchitis causes these tubes to fill with mucus, which can make it hard to breathe. It can also cause coughing or wheezing. In adults, acute bronchitis usually goes away within 2 weeks. A cough caused by bronchitis may last up to 3 weeks. Smoking, allergies, and asthma can make the condition worse. Repeated episodes of bronchitis may cause further lung problems, such as chronic obstructive pulmonary disease (COPD). What are the causes? This condition can be caused by germs and by substances that irritate the lungs, including:  Cold and flu viruses. This condition is most often caused by the same virus that causes a cold.  Bacteria.  Exposure to tobacco smoke, dust, fumes, and air pollution.  What increases the risk? This condition is  more likely to develop in people who:  Have close contact with someone with acute bronchitis.  Are exposed to lung irritants, such as tobacco smoke, dust, fumes, and vapors.  Have a weak immune system.  Have a respiratory condition such as asthma.  What are the signs or symptoms? Symptoms of this condition include:  A cough.  Coughing up clear, yellow, or green mucus.  Wheezing.  Chest congestion.  Shortness of breath.  A  fever.  Body aches.  Chills.  A sore throat.  How is this diagnosed? This condition is usually diagnosed with a physical exam. During the exam, your health care provider may order tests, such as chest X-rays, to rule out other conditions. He or she may also:  Test a sample of your mucus for bacterial infection.  Check the level of oxygen in your blood. This is done to check for pneumonia.  Do a chest X-ray or lung function testing to rule out pneumonia and other conditions.  Perform blood tests.  Your health care provider will also ask about your symptoms and medical history. How is this treated? Most cases of acute bronchitis clear up over time without treatment. Your health care provider may recommend:  Drinking more fluids. Drinking more makes your mucus thinner, which may make it easier to breathe.  Taking a medicine for a fever or cough.  Taking an antibiotic medicine.  Using an inhaler to help improve shortness of breath and to control a cough.  Using a cool mist vaporizer or humidifier to make it easier to breathe.  Follow these instructions at home: Medicines  Take over-the-counter and prescription medicines only as told by your health care provider.  If you were prescribed an antibiotic, take it as told by your health care provider. Do not stop taking the antibiotic even if you start to feel better. General instructions  Get plenty of rest.  Drink enough fluids to keep your urine clear or pale yellow.  Avoid smoking and secondhand smoke. Exposure to cigarette smoke or irritating chemicals will make bronchitis worse. If you smoke and you need help quitting, ask your health care provider. Quitting smoking will help your lungs heal faster.  Use an inhaler, cool mist vaporizer, or humidifier as told by your health care provider.  Keep all follow-up visits as told by your health care provider. This is important. How is this prevented? To lower your risk of  getting this condition again:  Wash your hands often with soap and water. If soap and water are not available, use hand sanitizer.  Avoid contact with people who have cold symptoms.  Try not to touch your hands to your mouth, nose, or eyes.  Make sure to get the flu shot every year.  Contact a health care provider if:  Your symptoms do not improve in 2 weeks of treatment. Get help right away if:  You cough up blood.  You have chest pain.  You have severe shortness of breath.  You become dehydrated.  You faint or keep feeling like you are going to faint.  You keep vomiting.  You have a severe headache.  Your fever or chills gets worse. This information is not intended to replace advice given to you by your health care provider. Make sure you discuss any questions you have with your health care provider. Document Released: 02/14/2004 Document Revised: 08/01/2015 Document Reviewed: 06/27/2015 Elsevier Interactive Patient Education  2017 [employer] Nonallergic Rhinitis Nonallergic rhinitis is a condition that causes symptoms  that affect the nose, such as a runny nose and a stuffed-up nose (nasal congestion) that can make it hard to breathe through the nose. This condition is different from having an allergy (allergic rhinitis). Allergic rhinitis occurs when the body's defense system (immune system) reacts to a substance that you are allergic to (allergen), such as pollen, pet dander, mold, or dust. Nonallergic rhinitis has many similar symptoms, but it is not caused by allergens. Nonallergic rhinitis can be a short-term or long-term problem. What are the causes? This condition can be caused by many different things. Some common types of nonallergic rhinitis include: Infectious rhinitis  This is usually due to an infection in the upper respiratory tract. Vasomotor rhinitis  This is the most common type of long-term nonallergic rhinitis.  It is caused by too much blood flow  through the nose, which makes the tissue inside of the nose swell.  Symptoms are often triggered by strong odors, cold air, stress, drinking alcohol, cigarette smoke, or changes in the weather. Occupational rhinitis  This type is caused by triggers in the workplace, such as chemicals, dusts, animal dander, or air pollution. Hormonal rhinitis  This type occurs in women as a result of an increase in the female hormone estrogen.  It may occur during pregnancy, puberty, and menstrual cycles.  Symptoms improve when estrogen levels drop. Drug-induced rhinitis Several drugs can cause nonallergic rhinitis, including:  Medicines that are used to treat high blood pressure, heart disease, and Parkinson disease.  Aspirin and NSAIDs.  Over-the-counter nasal decongestant sprays. These can cause a type of nonallergic rhinitis (rhinitis medicamentosa) when they are used for more than a few days.  Nonallergic rhinitis with eosinophilia syndrome (NARES)  This type is caused by having too much of a certain type of white blood cell (eosinophil). Nonallergic rhinitis can also be caused by a reaction to eating hot or spicy foods. This does not usually cause long-term symptoms. In some cases, the cause of nonallergic rhinitis is not known. What increases the risk? You are more likely to develop this condition if:  You are 4330-45 years of age.  You are a woman. Women are twice as likely to have this condition.  What are the signs or symptoms? Common symptoms of this condition include:  Nasal congestion.  Runny nose.  The feeling of mucus going down the back of the throat (postnasal drip).  Trouble sleeping at night and daytime sleepiness.  Less common symptoms include:  Sneezing.  Coughing.  Itchy nose.  Bloodshot eyes.  How is this diagnosed? This condition may be diagnosed based on:  Your symptoms and medical history.  A physical exam.  Allergy testing to rule out allergic  rhinitis. You may have skin tests or blood tests.  In some cases, the health care provider may take a swab of nasal secretions to look for an increased number of eosinophils. This would be done to confirm a diagnosis of NARES. How is this treated? Treatment for this condition depends on the cause. No single treatment works for everyone. Work with your health care provider to find the best treatment for you. Treatment may include:  Avoiding the things that trigger your symptoms.  Using medicines to relieve congestion, such as: ? Steroid nasal spray. There are many types. You may need to try a few to find out which one works best. ? Decongestant medicine. This may be an oral medicine or a nasal spray. These medicines are only used for a short time.  Using medicines to relieve a runny nose. These may include antihistamine medicines or anticholinergic nasal sprays.  Surgery to remove tissue from inside the nose may be needed in severe cases if the condition has not improved after 6-12 months of medical treatment. Follow these instructions at home:  Take or use over-the-counter and prescription medicines only as told by your health care provider. Do not stop using your medicine even if you start to feel better.  Use salt-water (saline) rinses or other solutions (nasal washes or irrigations) to wash or rinse out the inside of your nose as told by your health care provider.  Do not take NSAIDs or medicines that contain aspirin if they make your symptoms worse.  Do not drink alcohol if it makes your symptoms worse.  Do not use any tobacco products, such as cigarettes, chewing tobacco, and e-cigarettes. If you need help quitting, ask your health care provider.  Avoid secondhand smoke.  Get some exercise every day. Exercise may help reduce symptoms of nonallergic rhinitis for some people. Ask your health care provider how much exercise and what types of exercise are safe for you.  Sleep with the  head of your bed raised (elevated). This may reduce nighttime nasal congestion.  Keep all follow-up visits as told by your health care provider. This is important. Contact a health care provider if:  You have a fever.  Your symptoms are getting worse at home.  Your symptoms are not responding to medicine.  You develop new symptoms, especially a headache or nosebleed. This information is not intended to replace advice given to you by your health care provider. Make sure you discuss any questions you have with your health care provider. Document Released: 04/30/2015 Document Revised: 06/14/2015 Document Reviewed: 03/29/2015 Elsevier Interactive Patient Education  2018 [employer]  Eustachian Tube Dysfunction The eustachian tube connects the middle ear to the back of the nose. It regulates air pressure in the middle ear by allowing air to move between the ear and nose. It also helps to drain fluid from the middle ear space. When the eustachian tube does not function properly, air pressure, fluid, or both can build up in the middle ear. Eustachian tube dysfunction can affect one or both ears. What are the causes? This condition happens when the eustachian tube becomes blocked or cannot open normally. This may result from:  Ear infections.  Colds and other upper respiratory infections.  Allergies.  Irritation, such as from cigarette smoke or acid from the stomach coming up into the esophagus (gastroesophageal reflux).  Sudden changes in air pressure, such as from descending in an airplane.  Abnormal growths in the nose or throat, such as nasal polyps, tumors, or enlarged tissue at the back of the throat (adenoids).  What increases the risk? This condition may be more likely to develop in people who smoke and people who are overweight. Eustachian tube dysfunction may also be more likely to develop in children, especially children who have:  Certain birth defects of the mouth, such  as cleft palate.  Large tonsils and adenoids.  What are the signs or symptoms? Symptoms of this condition may include:  A feeling of fullness in the ear.  Ear pain.  Clicking or popping noises in the ear.  Ringing in the ear.  Hearing loss.  Loss of balance.  Symptoms may get worse when the air pressure around you changes, such as when you travel to an area of high elevation or fly on an airplane.  How is this diagnosed? This condition may be diagnosed based on:  Your symptoms.  A physical exam of your ear, nose, and throat.  Tests, such as those that measure: ? The movement of your eardrum (tympanogram). ? Your hearing (audiometry).  How is this treated? Treatment depends on the cause and severity of your condition. If your symptoms are mild, you may be able to relieve your symptoms by moving air into ("popping") your ears. If you have symptoms of fluid in your ears, treatment may include:  Decongestants.  Antihistamines.  Nasal sprays or ear drops that contain medicines that reduce swelling (steroids).  In some cases, you may need to have a procedure to drain the fluid in your eardrum (myringotomy). In this procedure, a small tube is placed in the eardrum to:  Drain the fluid.  Restore the air in the middle ear space.  Follow these instructions at home:  Take over-the-counter and prescription medicines only as told by your health care provider.  Use techniques to help pop your ears as recommended by your health care provider. These may include: ? Chewing gum. ? Yawning. ? Frequent, forceful swallowing. ? Closing your mouth, holding your nose closed, and gently blowing as if you are trying to blow air out of your nose.  Do not do any of the following until your health care provider approves: ? Travel to high altitudes. ? Fly in airplanes. ? Work in a Estate agentpressurized cabin or room. ? Scuba dive.  Keep your ears dry. Dry your ears completely after showering or  bathing.  Do not smoke.  Keep all follow-up visits as told by your health care provider. This is important. Contact a health care provider if:  Your symptoms do not go away after treatment.  Your symptoms come back after treatment.  You are unable to pop your ears.  You have: ? A fever. ? Pain in your ear. ? Pain in your head or neck. ? Fluid draining from your ear.  Your hearing suddenly changes.  You become very dizzy.  You lose your balance. This information is not intended to replace advice given to you by your health care provider. Make sure you discuss any questions you have with your health care provider. Document Released: 02/02/2015 Document Revised: 06/14/2015 Document Reviewed: 01/25/2014 Elsevier Interactive Patient Education  [employer]2018 [employer]

## 2017-02-12 ENCOUNTER — Telehealth: Payer: Self-pay | Admitting: *Deleted

## 2017-02-12 MED ORDER — FLUTICASONE PROPIONATE 50 MCG/ACT NA SUSP: 1.0000 | Freq: Two times a day (BID) | NASAL | 6 refills | Status: DC

## 2017-02-12 NOTE — Telephone Encounter (Signed)
Requesting Flonase refill. No longer carrying on site. Pharmacy of choice confirmed. Order placed per standing order for refill.

## 2017-02-12 NOTE — Telephone Encounter (Signed)
Noted agree with plan of care patient with allergic rhinitis.

## 2017-02-26 ENCOUNTER — Ambulatory Visit: Payer: Self-pay | Admitting: Registered Nurse

## 2017-02-26 VITALS — BP 111/72 | HR 53 | Temp 97.6°F

## 2017-02-26 DIAGNOSIS — I889 Nonspecific lymphadenitis, unspecified: Secondary | ICD-10-CM

## 2017-02-26 DIAGNOSIS — L739 Follicular disorder, unspecified: Secondary | ICD-10-CM

## 2017-02-26 MED ORDER — SULFAMETHOXAZOLE-TRIMETHOPRIM 800-160 MG PO TABS: 1.0000 | ORAL_TABLET | Freq: Two times a day (BID) | ORAL | 0 refills | Status: AC

## 2017-02-26 NOTE — Progress Notes (Signed)
Subjective:    Patient ID: Madison Garner, female    DOB: 29-Dec-1970, 47 y.o.   MRN: [id number]  46y/o caucasian female established Pt reports cyst to L underarm. Noted last week when she doing monthly self breast exam. Denies any drainage. Reports it is more under the skin, does not come to a head.  Has been exercising more especially upper body with fencing, push ups and weights.  Has been losing weight.  Has been sweating a lot with exercising.  Changes razor monthly.  Did not get flu shot this year or any recent vaccinations/illnesses but seasonal allergies acting up and muscle soreness from working out.  Menstrual cycle getting irregular, currently on cycle.  Distant cousin on grandmothers side with breast cancer.  Mother with ovarian cancer.  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  Normal mammogram last fall.  Denied trauma      Review of Systems  Constitutional: Negative for activity change, appetite change, chills, diaphoresis, fatigue and fever.  HENT: Positive for congestion, ear pain, postnasal drip, rhinorrhea, sinus pressure and sinus pain. Negative for ear discharge, facial swelling, hearing loss, mouth sores, sneezing, sore throat, tinnitus, trouble swallowing and voice change.   Eyes: Negative for photophobia and visual disturbance.  Respiratory: Negative for cough, chest tightness, shortness of breath and wheezing.   Cardiovascular: Negative for chest pain, palpitations and leg swelling.  Gastrointestinal: Negative for abdominal distention, abdominal pain, diarrhea, nausea and vomiting.  Endocrine: Negative for cold intolerance and heat intolerance.  Genitourinary: Positive for menstrual problem. Negative for difficulty urinating.  Musculoskeletal: Positive for myalgias. Negative for arthralgias, back pain, gait problem, joint swelling, neck pain and neck stiffness.  Skin: Negative for color change, pallor, rash and wound.  Allergic/Immunologic: Positive for environmental allergies. Negative  for food allergies.  Neurological: Negative for dizziness, tremors, seizures, syncope, facial asymmetry, speech difficulty, weakness, light-headedness, numbness and headaches.  Hematological: Negative for adenopathy. Does not bruise/bleed easily.  Psychiatric/Behavioral: Negative for agitation, confusion and sleep disturbance.       Objective:   Physical Exam  Constitutional: She is oriented to person, place, and time. Vital signs are normal. She appears well-developed and well-nourished. She is active and cooperative.  Non-toxic appearance. She does not have a sickly appearance. She does not appear ill. No distress.  HENT:  Head: Normocephalic and atraumatic.  Right Ear: Hearing, external ear and ear canal normal. A middle ear effusion is present.  Left Ear: Hearing, external ear and ear canal normal. A middle ear effusion is present.  Nose: Mucosal edema and rhinorrhea present. No nose lacerations, sinus tenderness, nasal deformity, septal deviation or nasal septal hematoma. No epistaxis.  No foreign bodies. Right sinus exhibits no maxillary sinus tenderness and no frontal sinus tenderness. Left sinus exhibits no maxillary sinus tenderness and no frontal sinus tenderness.  Mouth/Throat: Uvula is midline and mucous membranes are normal. Mucous membranes are not pale, not dry and not cyanotic. She does not have dentures. No oral lesions. No trismus in the jaw. Normal dentition. No dental abscesses, uvula swelling, lacerations or dental caries. Posterior oropharyngeal edema and posterior oropharyngeal erythema present. No oropharyngeal exudate or tonsillar abscesses.  Cobblestoning posterior pharynx; bilateral TMs air fluid level clear; bilateral allergic shiners;   Eyes: Conjunctivae, EOM and lids are normal. Pupils are equal, round, and reactive to light. Right eye exhibits no chemosis, no discharge, no exudate and no hordeolum. No foreign body present in the right eye. Left eye exhibits no  chemosis, no discharge, no exudate and no  hordeolum. No foreign body present in the left eye. Right conjunctiva is not injected. Right conjunctiva has no hemorrhage. Left conjunctiva is not injected. Left conjunctiva has no hemorrhage. No scleral icterus. Right eye exhibits normal extraocular motion and no nystagmus. Left eye exhibits normal extraocular motion and no nystagmus. Right pupil is round and reactive. Left pupil is round and reactive. Pupils are equal.  Neck: Trachea normal, normal range of motion and phonation normal. Neck supple. No tracheal tenderness and no muscular tenderness present. No neck rigidity. No tracheal deviation, no edema, no erythema and normal range of motion present. No thyroid mass and no thyromegaly present.  Cardiovascular: Normal rate, regular rhythm, S1 normal, S2 normal, normal heart sounds and intact distal pulses. PMI is not displaced. Exam reveals no gallop and no friction rub.  No murmur heard. Pulses:      Radial pulses are 2+ on the right side, and 2+ on the left side.  Pulmonary/Chest: Effort normal and breath sounds normal. No accessory muscle usage or stridor. No respiratory distress. She has no decreased breath sounds. She has no wheezes. She has no rhonchi. She has no rales. She exhibits no tenderness.  No cough observed in exam room spoke full sentences without difficulty  Abdominal: Soft. Normal appearance. She exhibits no distension, no fluid wave and no ascites. There is no rigidity and no guarding.  Musculoskeletal: Normal range of motion. She exhibits no edema or tenderness.       Right shoulder: Normal.       Left shoulder: Normal.       Right elbow: Normal.      Left elbow: Normal.       Right wrist: Normal.       Left wrist: Normal.       Right hip: Normal.       Left hip: Normal.       Right knee: Normal.       Left knee: Normal.       Cervical back: Normal.       Thoracic back: Normal.       Lumbar back: Normal.       Right upper arm:  Normal.       Left upper arm: Normal.       Right forearm: Normal.       Left forearm: Normal.       Right hand: Normal.       Left hand: Normal.  Lymphadenopathy:       Head (right side): No submental, no submandibular, no tonsillar, no preauricular, no posterior auricular and no occipital adenopathy present.       Head (left side): No submental, no submandibular, no tonsillar, no preauricular, no posterior auricular and no occipital adenopathy present.    She has no cervical adenopathy.       Right cervical: No superficial cervical, no deep cervical and no posterior cervical adenopathy present.      Left cervical: No superficial cervical, no deep cervical and no posterior cervical adenopathy present.    She has axillary adenopathy.       Right axillary: No pectoral and no lateral adenopathy present.       Left axillary: Lateral adenopathy present. No pectoral adenopathy present. Left axillary nonmobile 1cm lymph node slightly tender central axilla; compared to right left axilla soft tissue fuller than right nonpitting 0-1+/4  Neurological: She is alert and oriented to person, place, and time. She has normal strength. She is not disoriented. She displays  no atrophy and no tremor. No cranial nerve deficit or sensory deficit. She exhibits normal muscle tone. She displays no seizure activity. Coordination and gait normal. GCS eye subscore is 4. GCS verbal subscore is 5. GCS motor subscore is 6.  On/off exam table; in/out of chair without difficulty; gait sure and steady in hallway  Skin: Skin is warm, dry and intact. Rash noted. No abrasion, no bruising, no burn, no ecchymosis, no laceration, no lesion, no petechiae and no purpura noted. Rash is macular, papular and maculopapular. Rash is not nodular, not pustular, not vesicular and not urticarial. She is not diaphoretic. No cyanosis or erythema. No pallor. Nails show no clubbing.     Bilateral axilla with 1x3cm area papules dry slightly increased  warmth at base of hair follicle  Psychiatric: She has a normal mood and affect. Her speech is normal and behavior is normal. Judgment and thought content normal. Cognition and memory are normal.  Nursing note and vitals reviewed.         Assessment & Plan:  A-folliculitis axillary and axillary lymphadenopathy  P-suspect lymphadenopathy from folliculitis. Mammogram normal last fall.  SBE monthly first month she has noticed.  Changed exercise routine fencing, pushups, weights  Discrete nodule nonmobile and fullness left greater than right axilla  Changes razor at beginning each month.  Given exitcare handout printed on lymphadenopathy discussed shoulder resolve within the next 6 weeks if not consider [place] axilla, repeat mammogram, labs.  Inflammation can also be source of lymphadenopathy per up to date.  Patient verbalized understanding information/instructions, agreed with plan of care and had no further questions at this time.  Discussed folliculitis, pseudofolliculitis barbae with patient e.g. Use shaving cream, shave with direction of hair growth not against, lather up well and shower/bathe first to allow hair to soften. Refrain from shaving at this time until current inflammation resolved. May apply OTC hydrocortisone cream sparingly to affected hair follicles if irritation after shaving.  If worsening redness/swelling/purulent discharge start bactrim DS po BID x 7 days #40 RF0 dispensed from [facility].  Throw away current razor.  May apply triple antibiotic to affected area given 4 UD after showering thin smear.   Discussed the difference between razor burn, cellulitis and folliculitis with patient. Patient verbalized understanding of information, instructions, agreed with plan of care and had no further questions at this time.

## 2017-02-26 NOTE — Patient Instructions (Signed)
Folliculitis Folliculitis is inflammation of the hair follicles. Folliculitis most commonly occurs on the scalp, thighs, legs, back, and buttocks. However, it can occur anywhere on the body. What are the causes? This condition may be caused by:  A bacterial infection (common).  A fungal infection.  A viral infection.  Coming into contact with certain chemicals, especially oils and tars.  Shaving or waxing.  Applying greasy ointments or creams to your skin often.  Long-lasting folliculitis and folliculitis that keeps coming back can be caused by bacteria that live in the nostrils. What increases the risk? This condition is more likely to develop in people with:  A weakened immune system.  Diabetes.  Obesity.  What are the signs or symptoms? Symptoms of this condition include:  Redness.  Soreness.  Swelling.  Itching.  Small white or yellow, pus-filled, itchy spots (pustules) that appear over a reddened area. If there is an infection that goes deep into the follicle, these may develop into a boil (furuncle).  A group of closely packed boils (carbuncle). These tend to form in hairy, sweaty areas of the body.  How is this diagnosed? This condition is diagnosed with a skin exam. To find what is causing the condition, your health care provider may take a sample of one of the pustules or boils for testing. How is this treated? This condition may be treated by:  Applying warm compresses to the affected areas.  Taking an antibiotic medicine or applying an antibiotic medicine to the skin.  Applying or bathing with an antiseptic solution.  Taking an over-the-counter medicine to help with itching.  Having a procedure to drain any pustules or boils. This may be done if a pustule or boil contains a lot of pus or fluid.  Laser hair removal. This may be done to treat long-lasting folliculitis.  Follow these instructions at home:  If directed, apply heat to the affected  area as often as told by your health care provider. Use the heat source that your health care provider recommends, such as a moist heat pack or a heating pad. ? Place a towel between your skin and the heat source. ? Leave the heat on for 20-30 minutes. ? Remove the heat if your skin turns bright red. This is especially important if you are unable to feel pain, heat, or cold. You may have a greater risk of getting burned.  If you were prescribed an antibiotic medicine, use it as told by your health care provider. Do not stop using the antibiotic even if you start to feel better.  Take over-the-counter and prescription medicines only as told by your health care provider.  Do not shave irritated skin.  Keep all follow-up visits as told by your health care provider. This is important. Get help right away if:  You have more redness, swelling, or pain in the affected area.  Red streaks are spreading from the affected area.  You have a fever. This information is not intended to replace advice given to you by your health care provider. Make sure you discuss any questions you have with your health care provider. Document Released: 03/17/2001 Document Revised: 07/27/2015 Document Reviewed: 10/27/2014 Elsevier Interactive Patient Education  2018 [employer] Lymphadenopathy Lymphadenopathy refers to swollen or enlarged lymph glands, also called lymph nodes. Lymph glands are part of your body's defense (immune) system, which protects the body from infections, germs, and diseases. Lymph glands are found in many locations in your body, including the neck, underarm, and groin.  Many things can cause lymph glands to become enlarged. When your immune system responds to germs, such as viruses or bacteria, infection-fighting cells and fluid build up. This causes the glands to grow in size. Usually, this is not something to worry about. The swelling and any soreness often go away without treatment. However,  swollen lymph glands can also be caused by a number of diseases. Your health care provider may do various tests to help determine the cause. If the cause of your swollen lymph glands cannot be found, it is important to monitor your condition to make sure the swelling goes away. Follow these instructions at home: Watch your condition for any changes. The following actions may help to lessen any discomfort you are feeling:  Get plenty of rest.  Take medicines only as directed by your health care provider. Your health care provider may recommend over-the-counter medicines for pain.  Apply moist heat compresses to the site of swollen lymph nodes as directed by your health care provider. This can help reduce any pain.  Check your lymph nodes daily for any changes.  Keep all follow-up visits as directed by your health care provider. This is important.  Contact a health care provider if:  Your lymph nodes are still swollen after 2 weeks.  Your swelling increases or spreads to other areas.  Your lymph nodes are hard, seem fixed to the skin, or are growing rapidly.  Your skin over the lymph nodes is red and inflamed.  You have a fever.  You have chills.  You have fatigue.  You develop a sore throat.  You have abdominal pain.  You have weight loss.  You have night sweats. Get help right away if:  You notice fluid leaking from the area of the enlarged lymph node.  You have severe pain in any area of your body.  You have chest pain.  You have shortness of breath. This information is not intended to replace advice given to you by your health care provider. Make sure you discuss any questions you have with your health care provider. Document Released: 10/16/2007 Document Revised: 06/14/2015 Document Reviewed: 08/11/2013 Elsevier Interactive Patient Education  [employer].

## 2017-03-17 ENCOUNTER — Telehealth: Payer: Self-pay | Admitting: *Deleted

## 2017-03-17 MED ORDER — FEXOFENADINE HCL 180 MG PO TABS: 180.0000 mg | ORAL_TABLET | Freq: Every day | ORAL | 3 refills | Status: DC

## 2017-03-17 NOTE — Telephone Encounter (Signed)
Noted/agree/signed

## 2017-07-06 ENCOUNTER — Other Ambulatory Visit: Payer: Self-pay | Admitting: *Deleted

## 2017-07-06 MED ORDER — PSEUDOEPHEDRINE HCL 30 MG PO TABS: 30.0000 mg | ORAL_TABLET | ORAL | 0 refills | Status: DC | PRN

## 2017-07-06 MED ORDER — PROMETHAZINE HCL 6.25 MG/5ML PO SYRP: 12.5000 mg | ORAL_SOLUTION | Freq: Every evening | ORAL | 0 refills | Status: DC | PRN

## 2017-07-06 NOTE — Telephone Encounter (Addendum)
Pt reports bilateral otalgia, sore throat, swollen tonsils, productive cough, fever (on 6/10-13) x1 week. Called [facility] on 6/12. Given Tessalon pearles only. Went to [facility] on 6/13. Rapid strep negative but treated prophylactically with Augmentin for presumed strep and acute OME. Taking Guaifenesin during the day. Tessalon pearles were not helping at all with cough. Cough keeping her up at night. Began taking leftover Promethazine syrup prescribed by NP Betancourt in Nov 2018 at night.  Also requesting refill pseudoephedrine 30mg  tabs for filling with FSA funds.   Phone order received from NP Betancourt for refill of Promethazine syrup 6.25mg /445ml, take 10mls (12.5mg  total)

## 2017-07-21 ENCOUNTER — Ambulatory Visit: Payer: Self-pay | Admitting: Registered Nurse

## 2017-07-21 ENCOUNTER — Encounter: Payer: Self-pay | Admitting: Registered Nurse

## 2017-07-21 VITALS — BP 129/83 | HR 65 | Temp 98.9°F

## 2017-07-21 DIAGNOSIS — M722 Plantar fascial fibromatosis: Secondary | ICD-10-CM

## 2017-07-21 DIAGNOSIS — M25572 Pain in left ankle and joints of left foot: Secondary | ICD-10-CM

## 2017-07-21 NOTE — Progress Notes (Signed)
Subjective:    Patient ID: Madison Garner, female    DOB: March 14, 1970, 47 y.o.   MRN: [id number]  47y/o caucasian female married established patient with L lateral foot pain that she questions as r/t plantar fascitis. Present for approx 1.5 months. Pain worse throughout day while walking.  Especially stiff after sitting then getting back up prior to bed.  Sometimes limping at that time.  Doesn't have bad pain when exercising or limping Throbbing, burning pain. Has been performing plantar fascitis stretches but does not feel that they are affecting the side of her foot where pain is now located.  Really flared up after going on [place] trip to [place] two weeks ago walked 30 miles that week.  [facility] [place] last year and fitted for correct running shoes.  Typically runs or walks for exercise and has been on weight loss regimen with diet also and lost a significant amount of weight in past year.  Sketchers she is wearing are less than 561 month old today and she wears new balance running shoes.  Was sick last week so thought if due to overdoing it on trip it should have resolved with rest.  Hasn't noticed hot spot, swelling, bruising, rash.  Just a tender area when touching on lateral foot.  Denied falls/trips/rolling ankle or foot.  Tends to wear out her shoes on outside edge old shoes will lean outward.     Review of Systems  Constitutional: Negative for activity change, appetite change, chills, diaphoresis, fatigue and fever.  HENT: Negative for trouble swallowing and voice change.   Eyes: Negative for photophobia and visual disturbance.  Respiratory: Negative for cough, shortness of breath, wheezing and stridor.   Cardiovascular: Negative for leg swelling.  Gastrointestinal: Negative for diarrhea, nausea and vomiting.  Endocrine: Negative for cold intolerance and heat intolerance.  Genitourinary: Negative for difficulty urinating.  Musculoskeletal: Positive for gait  problem and myalgias. Negative for arthralgias, back pain, joint swelling, neck pain and neck stiffness.  Skin: Negative for color change, pallor, rash and wound.  Allergic/Immunologic: Positive for environmental allergies. Negative for food allergies.  Neurological: Negative for dizziness, tremors, seizures, syncope, facial asymmetry, speech difficulty, weakness, light-headedness, numbness and headaches.  Hematological: Negative for adenopathy. Does not bruise/bleed easily.  Psychiatric/Behavioral: Negative for agitation, confusion and sleep disturbance.       Objective:   Physical Exam  Constitutional: She is oriented to person, place, and time. Vital signs are normal. She appears well-developed and well-nourished. She is active and cooperative.  Non-toxic appearance. She does not have a sickly appearance. She does not appear ill. No distress.  HENT:  Head: Normocephalic and atraumatic.  Right Ear: Hearing and external ear normal.  Left Ear: Hearing and external ear normal.  Nose: Nose normal.  Mouth/Throat: Uvula is midline, oropharynx is clear and moist and mucous membranes are normal. No oropharyngeal exudate.  Bilateral allergic shiners  Eyes: Pupils are equal, round, and reactive to light. Conjunctivae, EOM and lids are normal. Right eye exhibits no discharge. Left eye exhibits no discharge. No scleral icterus.  Neck: Trachea normal and normal range of motion. Neck supple. No tracheal deviation present.  Cardiovascular: Normal rate, regular rhythm, normal heart sounds and intact distal pulses.  Pulses:      Dorsalis pedis pulses are 2+ on the right side, and 2+ on the left side.       Posterior tibial pulses are 2+ on the right side, and 2+ on the left  side.  Pulmonary/Chest: Effort normal and breath sounds normal. No stridor. No respiratory distress. She has no decreased breath sounds. She has no wheezes. She has no rhonchi. She has no rales.  Abdominal: Soft. Normal appearance. She  exhibits no distension. There is no guarding.  Musculoskeletal: Normal range of motion. She exhibits tenderness. She exhibits no edema or deformity.       Right shoulder: Normal.       Left shoulder: Normal.       Right elbow: Normal.      Left elbow: Normal.       Right hip: Normal.       Left hip: Normal.       Right knee: Normal.       Left knee: Normal.       Right ankle: Normal.       Left ankle: Normal.       Cervical back: Normal.       Thoracic back: Normal.       Lumbar back: Normal.       Right hand: Normal.       Left hand: Normal.       Right lower leg: Normal.       Left lower leg: Normal.       Right foot: There is tenderness. There is normal range of motion, no bony tenderness, no swelling, normal capillary refill, no crepitus, no deformity and no laceration.       Left foot: There is tenderness. There is normal range of motion, no bony tenderness, no swelling, normal capillary refill, no crepitus, no deformity and no laceration.       Feet:  Lateral edge tread wear bilateral shoes and heel strike; TTP soft tissue adjacent 5th MTP joint and MCP mid foot firm tender nodule less than 1cm diameter no increased temperature or erythema; patient wearing cotton socks noncompression newer sketcher tennis shoes; heel anterior calcaneous TTP left greater than right; no nodule noted right foot 5th MCP lateral; no hot spots noted on bilateral feet  Lymphadenopathy:    She has no cervical adenopathy.  Neurological: She is alert and oriented to person, place, and time. She has normal strength. She is not disoriented. She displays no atrophy and no tremor. No cranial nerve deficit or sensory deficit. She exhibits normal muscle tone. She displays no seizure activity. Coordination and gait normal. GCS eye subscore is 4. GCS verbal subscore is 5. GCS motor subscore is 6.  On/off exam table; in/out of chair without difficulty; gait sure and steady in hallway no limp  Skin: Skin is warm, dry  and intact. Capillary refill takes less than 2 seconds. No rash noted. She is not diaphoretic. No erythema. No pallor.  callouses on bilateral feet lateral and medial edges/calcaneous  Psychiatric: She has a normal mood and affect. Her speech is normal and behavior is normal. Judgment and thought content normal. She is not actively hallucinating. Cognition and memory are normal. She is attentive.  Nursing note and vitals reviewed.         Assessment & Plan:  A-left foot pain  P-Consider compression socks for plantar fascittis discussed with patient places to buy and current sales.  Has had feet imaging/shoe fitting at [facility] and that did help plantar fasciitis.  Using foot roller that is not ice discussed freezing water bottle and rolling under feet, toe and foot and ankle stretches after run/walk end of day; if limping with running stop consider pool and  bike instead; pumice stone to remove callous lateral feet prn may continue OTC NSAIDS  Follow up next week for re-evaluation  Watch for hot spots from footwear and take picture for me and show me at follow up appt.  Patient given Exitcare handout on plantar fasciitis with rehab exercises and foot pain. Discussed stretches and icing. Follow up with PCM if no improvement with discussed care for podiatry referral. continue NSAID of choice (taken previously without side effects). Consider new supportive footwear/OTC inserts if shoe treads worn out/greater than 40 year old/shoes not maintaining neutral position.. Patient verbalized understanding of instructions, agreed with plan of care and had no further questions at this time.

## 2017-07-21 NOTE — Patient Instructions (Signed)
Plantar Fasciitis Rehab Ask your health care provider which exercises are safe for you. Do exercises exactly as told by your health care provider and adjust them as directed. It is normal to feel mild stretching, pulling, tightness, or discomfort as you do these exercises, but you should stop right away if you feel sudden pain or your pain gets worse. Do not begin these exercises until told by your health care provider. Stretching and range of motion exercises These exercises warm up your muscles and joints and improve the movement and flexibility of your foot. These exercises also help to relieve pain. Exercise A: Plantar fascia stretch  1. Sit with your left / right leg crossed over your opposite knee. 2. Hold your heel with one hand with that thumb near your arch. With your other hand, hold your toes and gently pull them back toward the top of your foot. You should feel a stretch on the bottom of your toes or your foot or both. 3. Hold this stretch for_____15_____ seconds. 4. Slowly release your toes and return to the starting position. Repeat ____3______ times. Complete this exercise _____2_____ times a day. Exercise B: Gastroc, standing  1. Stand with your hands against a wall. 2. Extend your left / right leg behind you, and bend your front knee slightly. 3. Keeping your heels on the floor and keeping your back knee straight, shift your weight toward the wall without arching your back. You should feel a gentle stretch in your left / right calf. 4. Hold this position for _____15_____ seconds. Repeat __3________ times. Complete this exercise ______2____ times a day. Exercise C: Soleus, standing 1. Stand with your hands against a wall. 2. Extend your left / right leg behind you, and bend your front knee slightly. 3. Keeping your heels on the floor, bend your back knee and slightly shift your weight over the back leg. You should feel a gentle stretch deep in your calf. 4. Hold this position  for ____15______ seconds. Repeat ___3_______ times. Complete this exercise _____2_____ times a day. Exercise D: Gastrocsoleus, standing 1. Stand with the ball of your left / right foot on a step. The ball of your foot is on the walking surface, right under your toes. 2. Keep your other foot firmly on the same step. 3. Hold onto the wall or a railing for balance. 4. Slowly lift your other foot, allowing your body weight to press your heel down over the edge of the step. You should feel a stretch in your left / right calf. 5. Hold this position for ____15______ seconds. 6. Return both feet to the step. 7. Repeat this exercise with a slight bend in your left / right knee. Repeat ______3____ times with your left / right knee straight and ___3_______ times with your left / right knee bent. Complete this exercise ___2_______ times a day. Balance exercise This exercise builds your balance and strength control of your arch to help take pressure off your plantar fascia. Exercise E: Single leg stand 1. Without shoes, stand near a railing or in a doorway. You may hold onto the railing or door frame as needed. 2. Stand on your left / right foot. Keep your big toe down on the floor and try to keep your arch lifted. Do not let your foot roll inward. 3. Hold this position for _____15_____ seconds. 4. If this exercise is too easy, you can try it with your eyes closed or while standing on a pillow. Repeat _____3_____ times. Complete  this exercise ____2______ times a day. This information is not intended to replace advice given to you by your health care provider. Make sure you discuss any questions you have with your health care provider. Document Released: 01/06/2005 Document Revised: 09/11/2015 Document Reviewed: 11/20/2014 Elsevier Interactive Patient Education  2018 [employer].  How to Use Compression Stockings Compression stockings are elastic socks that squeeze the legs. They help to increase blood  flow to the legs, decrease swelling in the legs, and reduce the chance of developing blood clots in the lower legs. Compression stockings are often used by people who:  Are recovering from surgery.  Have poor circulation in their legs.  Are prone to getting blood clots in their legs.  Have varicose veins.  Sit or stay in bed for long periods of time.  How to use compression stockings Before you put on your compression stockings:  Make sure that they are the correct size. If you do not know your size, ask your health care provider.  Make sure that they are clean, dry, and in good condition.  Check them for rips and tears. Do not put them on if they are ripped or torn.  Put your stockings on first thing in the morning, before you get out of bed. Keep them on for as long as your health care provider advises. When you are wearing your stockings:  Keep them as smooth as possible. Do not allow them to bunch up. It is especially important to prevent the stockings from bunching up around your toes or behind your knees.  Do not roll the stockings downward and leave them rolled down. This can decrease blood flow to your leg.  Change them right away if they become wet or dirty.  When you take off your stockings, inspect your legs and feet. Anything that does not seem normal may require medical attention. Look for:  Open sores.  Red spots.  Swelling.  Information and tips  Do not stop wearing your compression stockings without talking to your health care provider first.  Wash your stockings every day with mild detergent in cold or warm water. Do not use bleach. Air-dry your stockings or dry them in a clothes dryer on low heat.  Replace your stockings every 3-6 months.  If skin moisturizing is part of your treatment plan, apply lotion or cream at night so that your skin will be dry when you put on the stockings in the morning. It is harder to put the stockings on when you have lotion  on your legs or feet. Contact a health care provider if: Remove your stockings and seek medical care if:  You have a feeling of pins and needles in your feet or legs.  You have any new changes in your skin.  You have skin lesions that are getting worse.  You have swelling or pain that is getting worse.  Get help right away if:  You have numbness or tingling in your lower legs that does not get better right after you take the stockings off.  Your toes or feet become cold and blue.  You develop open sores or red spots on your legs that do not go away.  You see or feel a warm spot on your leg.  You have new swelling or soreness in your leg.  You are short of breath or you have chest pain for no reason.  You have a rapid or irregular heartbeat.  You feel light-headed or dizzy. This information  is not intended to replace advice given to you by your health care provider. Make sure you discuss any questions you have with your health care provider. Document Released: 11/03/2008 Document Revised: 06/06/2015 Document Reviewed: 12/14/2013 Elsevier Interactive Patient Education  2018 [employer]  Foot Pain Many things can cause foot pain. Some common causes are:  An injury.  A sprain.  Arthritis.  Blisters.  Bunions.  Follow these instructions at home: Pay attention to any changes in your symptoms. Take these actions to help with your discomfort:  If directed, put ice on the affected area: ? Put ice in a plastic bag. ? Place a towel between your skin and the bag. ? Leave the ice on for 15-20 minutes, 3?4 times a day for 2 days.  Take over-the-counter and prescription medicines only as told by your health care provider.  Wear comfortable, supportive shoes that fit you well. Do not wear high heels.  Do not stand or walk for long periods of time.  Do not lift a lot of weight. This can put added pressure on your feet.  Do stretches to relieve foot pain and stiffness  as told by your health care provider.  Rub your foot gently.  Keep your feet clean and dry.  Contact a health care provider if:  Your pain does not get better after a few days of self-care.  Your pain gets worse.  You cannot stand on your foot. Get help right away if:  Your foot is numb or tingling.  Your foot or toes are swollen.  Your foot or toes turn white or blue.  You have warmth and redness along your foot. This information is not intended to replace advice given to you by your health care provider. Make sure you discuss any questions you have with your health care provider. Document Released: 02/02/2015 Document Revised: 06/14/2015 Document Reviewed: 02/01/2014 Elsevier Interactive Patient Education  2018 [employer]  Plantar Fasciitis Plantar fasciitis is a painful foot condition that affects the heel. It occurs when the band of tissue that connects the toes to the heel bone (plantar fascia) becomes irritated. This can happen after exercising too much or doing other repetitive activities (overuse injury). The pain from plantar fasciitis can range from mild irritation to severe pain that makes it difficult for you to walk or move. The pain is usually worse in the morning or after you have been sitting or lying down for a while. What are the causes? This condition may be caused by:  Standing for long periods of time.  Wearing shoes that do not fit.  Doing high-impact activities, including running, aerobics, and ballet.  Being overweight.  Having an abnormal way of walking (gait).  Having tight calf muscles.  Having high arches in your feet.  Starting a new athletic activity.  What are the signs or symptoms? The main symptom of this condition is heel pain. Other symptoms include:  Pain that gets worse after activity or exercise.  Pain that is worse in the morning or after resting.  Pain that goes away after you walk for a few minutes.  How is this  diagnosed? This condition may be diagnosed based on your signs and symptoms. Your health care provider will also do a physical exam to check for:  A tender area on the bottom of your foot.  A high arch in your foot.  Pain when you move your foot.  Difficulty moving your foot.  You may also need to have  imaging studies to confirm the diagnosis. These can include:  X-rays.  Ultrasound.  MRI.  How is this treated? Treatment for plantar fasciitis depends on the severity of the condition. Your treatment may include:  Rest, ice, and over-the-counter pain medicines to manage your pain.  Exercises to stretch your calves and your plantar fascia.  A splint that holds your foot in a stretched, upward position while you sleep (night splint).  Physical therapy to relieve symptoms and prevent problems in the future.  Cortisone injections to relieve severe pain.  Extracorporeal shock wave therapy (ESWT) to stimulate damaged plantar fascia with electrical impulses. It is often used as a last resort before surgery.  Surgery, if other treatments have not worked after 12 months.  Follow these instructions at home:  Take medicines only as directed by your health care provider.  Avoid activities that cause pain.  Roll the bottom of your foot over a bag of ice or a bottle of cold water. Do this for 20 minutes, 3-4 times a day.  Perform simple stretches as directed by your health care provider.  Try wearing athletic shoes with air-sole or gel-sole cushions or soft shoe inserts.  Wear a night splint while sleeping, if directed by your health care provider.  Keep all follow-up appointments with your health care provider. How is this prevented?  Do not perform exercises or activities that cause heel pain.  Consider finding low-impact activities if you continue to have problems.  Lose weight if you need to. The best way to prevent plantar fasciitis is to avoid the activities that  aggravate your plantar fascia. Contact a health care provider if:  Your symptoms do not go away after treatment with home care measures.  Your pain gets worse.  Your pain affects your ability to move or do your daily activities. This information is not intended to replace advice given to you by your health care provider. Make sure you discuss any questions you have with your health care provider. Document Released: 10/01/2000 Document Revised: 06/11/2015 Document Reviewed: 11/16/2013 Elsevier Interactive Patient Education  [employer].

## 2017-10-29 ENCOUNTER — Other Ambulatory Visit: Payer: Self-pay | Admitting: *Deleted

## 2017-10-29 DIAGNOSIS — J301 Allergic rhinitis due to pollen: Secondary | ICD-10-CM

## 2017-10-29 MED ORDER — PSEUDOEPHEDRINE HCL 30 MG PO TABS: 30.0000 mg | ORAL_TABLET | ORAL | 1 refills | Status: DC | PRN

## 2017-11-09 ENCOUNTER — Other Ambulatory Visit: Payer: Self-pay | Admitting: Family Medicine

## 2017-11-09 DIAGNOSIS — R928 Other abnormal and inconclusive findings on diagnostic imaging of breast: Secondary | ICD-10-CM

## 2017-11-26 ENCOUNTER — Ambulatory Visit: Payer: Self-pay | Admitting: Registered Nurse

## 2017-11-26 ENCOUNTER — Encounter: Payer: Self-pay | Admitting: Registered Nurse

## 2017-11-26 VITALS — BP 132/94 | HR 59 | Temp 98.0°F

## 2017-11-26 DIAGNOSIS — J301 Allergic rhinitis due to pollen: Secondary | ICD-10-CM

## 2017-11-26 DIAGNOSIS — J019 Acute sinusitis, unspecified: Secondary | ICD-10-CM

## 2017-11-26 DIAGNOSIS — H65191 Other acute nonsuppurative otitis media, right ear: Secondary | ICD-10-CM

## 2017-11-26 MED ORDER — MONTELUKAST SODIUM 10 MG PO TABS
10.0000 mg | ORAL_TABLET | Freq: Every day | ORAL | 3 refills | Status: DC
Start: 2017-11-26 — End: 2019-10-20

## 2017-11-26 MED ORDER — AMOXICILLIN-POT CLAVULANATE 875-125 MG PO TABS: 1.0000 | ORAL_TABLET | Freq: Two times a day (BID) | ORAL | 0 refills | Status: AC

## 2017-11-26 NOTE — Progress Notes (Signed)
Subjective:    Patient ID: Madison Garner, female    DOB: May 27, 1970, 47 y.o.   MRN: [id number]  47y/o caucasian female established married patient with worsening sinus pain/pressure, post nasal drip/rhinits, sore throat and right ear pain greater than left.  Denied discharge/fever.   Feel likes right side of face swollen and is tender if she tries to sleep on her right side.  Restarted flonase a week ago.  Wondering if she needs to change to a different antihistamine than allegra.  Supplementing with sudafed but not working as well this time to clear up congestion.  Nasal saline once a day.  Last flare up one year ago currently not taking singulair.  Did seem to help last time.     Review of Systems  Constitutional: Positive for fatigue. Negative for activity change, appetite change, chills, diaphoresis, fever and unexpected weight change.  HENT: Positive for congestion, ear pain, facial swelling, postnasal drip, rhinorrhea, sinus pressure, sinus pain and sore throat. Negative for dental problem, drooling, ear discharge, hearing loss, mouth sores, nosebleeds, sneezing, tinnitus, trouble swallowing and voice change.   Eyes: Negative for photophobia, pain, discharge, redness, itching and visual disturbance.  Respiratory: Negative for cough, choking, chest tightness, shortness of breath, wheezing and stridor.   Cardiovascular: Negative for chest pain.  Gastrointestinal: Negative for abdominal distention, abdominal pain, diarrhea, nausea and vomiting.  Endocrine: Negative for cold intolerance and heat intolerance.  Genitourinary: Negative for difficulty urinating, dysuria and hematuria.  Musculoskeletal: Negative for arthralgias, gait problem, myalgias, neck pain and neck stiffness.  Skin: Negative for color change, pallor, rash and wound.  Allergic/Immunologic: Positive for environmental allergies. Negative for food allergies.  Neurological: Positive for headaches. Negative for dizziness,  tremors, seizures, syncope, facial asymmetry, speech difficulty, weakness, light-headedness and numbness.  Hematological: Negative for adenopathy. Does not bruise/bleed easily.  Psychiatric/Behavioral: Negative for agitation, behavioral problems, confusion and sleep disturbance.       Objective:   Physical Exam  Constitutional: She is oriented to person, place, and time. She appears well-developed and well-nourished. She is active and cooperative.  Non-toxic appearance. She does not have a sickly appearance. She appears ill. No distress.  HENT:  Head: Normocephalic and atraumatic.  Right Ear: Hearing, external ear and ear canal normal. There is tenderness. Tympanic membrane is erythematous and bulging. A middle ear effusion is present.  Left Ear: Hearing, external ear and ear canal normal. There is tenderness. Tympanic membrane is erythematous. A middle ear effusion is present.  Ears:  Nose: Mucosal edema and rhinorrhea present. No nose lacerations, sinus tenderness, nasal deformity, septal deviation or nasal septal hematoma. No epistaxis.  No foreign bodies. Right sinus exhibits maxillary sinus tenderness and frontal sinus tenderness. Left sinus exhibits maxillary sinus tenderness and frontal sinus tenderness.  Mouth/Throat: Uvula is midline and mucous membranes are normal. Mucous membranes are not pale, not dry and not cyanotic. She does not have dentures. No oral lesions. No trismus in the jaw. Normal dentition. No dental abscesses, uvula swelling, lacerations or dental caries. Posterior oropharyngeal edema and posterior oropharyngeal erythema present. No oropharyngeal exudate or tonsillar abscesses. Tonsils are 1+ on the right. Tonsils are 1+ on the left. No tonsillar exudate.  Cobblestoning posterior pharynx; drip from above slightly opaque thick oropharynx; bilateral allergic shiners; right TM erythema air fluid level slight opacity bulging ;left TM air fluid level clear linear erythema 12  oclock; nasal turbinates edema/erythema clear discharge; maxillary greater than frontal sinuses right greater than left; patient very sensitive to  otoscope speculum entering auditory canal bilaterally; dry skin noted some flaking throughout canal  Eyes: Pupils are equal, round, and reactive to light. Conjunctivae, EOM and lids are normal. Right eye exhibits no chemosis, no discharge, no exudate and no hordeolum. No foreign body present in the right eye. Left eye exhibits no chemosis, no discharge, no exudate and no hordeolum. No foreign body present in the left eye. Right conjunctiva is not injected. Right conjunctiva has no hemorrhage. Left conjunctiva is not injected. Left conjunctiva has no hemorrhage. No scleral icterus. Right eye exhibits normal extraocular motion and no nystagmus. Left eye exhibits normal extraocular motion and no nystagmus. Right pupil is round and reactive. Left pupil is round and reactive. Pupils are equal.  Neck: Trachea normal, normal range of motion and phonation normal. Neck supple. No tracheal tenderness, no spinous process tenderness and no muscular tenderness present. No neck rigidity. No tracheal deviation, no edema, no erythema and normal range of motion present. No thyroid mass and no thyromegaly present.  Cardiovascular: Normal rate, regular rhythm, S1 normal, S2 normal, normal heart sounds and intact distal pulses. PMI is not displaced. Exam reveals no gallop and no friction rub.  No murmur heard. Pulmonary/Chest: Effort normal and breath sounds normal. No accessory muscle usage or stridor. No respiratory distress. She has no decreased breath sounds. She has no wheezes. She has no rhonchi. She has no rales. She exhibits no tenderness.  No cough observed in exam room; spoke full sentences without difficulty; frequent nasal clearing/congestion  Abdominal: Soft. Normal appearance. She exhibits no distension. There is no rigidity and no guarding.  Musculoskeletal: Normal  range of motion. She exhibits no edema or tenderness.       Right shoulder: Normal.       Left shoulder: Normal.       Right elbow: Normal.      Left elbow: Normal.       Right hip: Normal.       Left hip: Normal.       Right knee: Normal.       Left knee: Normal.       Cervical back: Normal.       Thoracic back: Normal.       Lumbar back: Normal.       Right hand: Normal.       Left hand: Normal.  Lymphadenopathy:       Head (right side): No submental, no submandibular, no tonsillar, no preauricular, no posterior auricular and no occipital adenopathy present.       Head (left side): No submental, no submandibular, no tonsillar, no preauricular, no posterior auricular and no occipital adenopathy present.    She has no cervical adenopathy.       Right cervical: No superficial cervical, no deep cervical and no posterior cervical adenopathy present.      Left cervical: No superficial cervical, no deep cervical and no posterior cervical adenopathy present.  Neurological: She is alert and oriented to person, place, and time. She has normal strength. She is not disoriented. She displays no atrophy and no tremor. No cranial nerve deficit or sensory deficit. She exhibits normal muscle tone. She displays no seizure activity. Coordination and gait normal. GCS eye subscore is 4. GCS verbal subscore is 5. GCS motor subscore is 6.  On/off exam table/in/out of chair without difficulty; gait sure and steady in hallway  Skin: Skin is warm, dry and intact. Capillary refill takes less than 2 seconds. No abrasion, no bruising, no  burn, no ecchymosis, no laceration, no lesion, no petechiae and no rash noted. She is not diaphoretic. No cyanosis or erythema. No pallor. Nails show no clubbing.  Psychiatric: She has a normal mood and affect. Her speech is normal and behavior is normal. Judgment and thought content normal. She is not actively hallucinating. Cognition and memory are normal. She is attentive.  Nursing  note and vitals reviewed.         Assessment & Plan:  A-right otitis media acute; acute rhinosinusitis, seasonal allergic rhinitis, elevated blood pressure  P-sudafed 30mg  1-2 tabs po q4-6h prn rhinitis  max 240mg  per 24 hours caution may cause drowsiness, insomnia.  Patient has taken previously without difficulty.   singulair electronic Rx to pharmacy of choice 10mg  po qhs #30 RF3; Patient may use normal saline nasal spray 2 sprays each nostril q2h wa as needed. flonase 1 spray each nostril BID.  Patient denied personal or family history of ENT cancer.  OTC antihistamine of choice allegra 180mg  po daily.  Avoid triggers if possible.  Shower prior to bedtime if exposed to triggers.  If allergic dust/dust mites recommend mattress/pillow covers/encasements; washing linens, vacuuming, sweeping, dusting weekly.  Call or return to clinic as needed if these symptoms worsen or fail to improve as anticipated.   Exitcare handout on  rhinitis and sinus rinse.  Patient verbalized understanding of instructions, agreed with plan of care and had no further questions at this time.  P2:  Avoidance and hand washing.  Restart singulair may continue allegra/flonase/saline and sudafed see above.  Continue flonase 1 spray each nostril BID, saline 2 sprays each nostril q2h wa prn congestion.  If no improvement with 48 hours with this plan of care start augmentin 875mg  po BID x 10 days #20 RF0 dispensed to patient from [facility].  Denied personal or family history of ENT cancer.  Shower BID especially prior to bed. No evidence of systemic bacterial infection, non toxic and well hydrated.  I do not see where any further testing or imaging is necessary at this time.   I will suggest supportive care, rest, good hygiene and encourage the patient to take adequate fluids.  The patient is to return to clinic or EMERGENCY ROOM if symptoms worsen or change significantly.  Exitcare handout on sinusitis and sinus rinse.  Patient  verbalized agreement and understanding of treatment plan and had no further questions at this time.   P2:  Hand washing and cover cough  augmentin 875mg  po BID x 10 days #20 RF0 dispensed from [facility] to patient  disccused stop qtip use in ears only finger with towel or washcloth and may instill 5-10gtts mineral oil/baby oil if itchy as dry skin noted.  Supportive treatment.   No evidence of invasive bacterial infection, non toxic and well hydrated.  This is most likely self limiting viral infection.  I do not see where any further testing or imaging is necessary at this time.   I will suggest supportive care, rest, good hygiene and encourage the patient to take adequate fluids.  The patient is to return to clinic or EMERGENCY ROOM if symptoms worsen or change significantly e.g. ear pain, fever, purulent discharge from ears or bleeding.  Exitcare handout on otitis media.  Patient verbalized agreement and understanding of treatment plan and had no further questions at this time  On sudafed increases BP along with allegra use and acute URI.  If visual changes/worst headache of life/chest pain/dyspnea seek re-evaluation same day.  Patient  verbalized understanding information/instructions, agreed with plan of care and had no further questions at this time.

## 2017-11-26 NOTE — Patient Instructions (Signed)
Nonallergic Rhinitis Nonallergic rhinitis is a condition that causes symptoms that affect the nose, such as a runny nose and a stuffed-up nose (nasal congestion) that can make it hard to breathe through the nose. This condition is different from having an allergy (allergic rhinitis). Allergic rhinitis occurs when the body's defense system (immune system) reacts to a substance that you are allergic to (allergen), such as pollen, pet dander, mold, or dust. Nonallergic rhinitis has many similar symptoms, but it is not caused by allergens. Nonallergic rhinitis can be a short-term or long-term problem. What are the causes? This condition can be caused by many different things. Some common types of nonallergic rhinitis include: Infectious rhinitis  This is usually due to an infection in the upper respiratory tract. Vasomotor rhinitis  This is the most common type of long-term nonallergic rhinitis.  It is caused by too much blood flow through the nose, which makes the tissue inside of the nose swell.  Symptoms are often triggered by strong odors, cold air, stress, drinking alcohol, cigarette smoke, or changes in the weather. Occupational rhinitis  This type is caused by triggers in the workplace, such as chemicals, dusts, animal dander, or air pollution. Hormonal rhinitis  This type occurs in women as a result of an increase in the female hormone estrogen.  It may occur during pregnancy, puberty, and menstrual cycles.  Symptoms improve when estrogen levels drop. Drug-induced rhinitis Several drugs can cause nonallergic rhinitis, including:  Medicines that are used to treat high blood pressure, heart disease, and Parkinson disease.  Aspirin and NSAIDs.  Over-the-counter nasal decongestant sprays. These can cause a type of nonallergic rhinitis (rhinitis medicamentosa) when they are used for more than a few days.  Nonallergic rhinitis with eosinophilia syndrome (NARES)  This type is caused  by having too much of a certain type of white blood cell (eosinophil). Nonallergic rhinitis can also be caused by a reaction to eating hot or spicy foods. This does not usually cause long-term symptoms. In some cases, the cause of nonallergic rhinitis is not known. What increases the risk? You are more likely to develop this condition if:  You are 30-60 years of age.  You are a woman. Women are twice as likely to have this condition.  What are the signs or symptoms? Common symptoms of this condition include:  Nasal congestion.  Runny nose.  The feeling of mucus going down the back of the throat (postnasal drip).  Trouble sleeping at night and daytime sleepiness.  Less common symptoms include:  Sneezing.  Coughing.  Itchy nose.  Bloodshot eyes.  How is this diagnosed? This condition may be diagnosed based on:  Your symptoms and medical history.  A physical exam.  Allergy testing to rule out allergic rhinitis. You may have skin tests or blood tests.  In some cases, the health care provider may take a swab of nasal secretions to look for an increased number of eosinophils. This would be done to confirm a diagnosis of NARES. How is this treated? Treatment for this condition depends on the cause. No single treatment works for everyone. Work with your health care provider to find the best treatment for you. Treatment may include:  Avoiding the things that trigger your symptoms.  Using medicines to relieve congestion, such as: ? Steroid nasal spray. There are many types. You may need to try a few to find out which one works best. ? Decongestant medicine. This may be an oral medicine or a nasal spray. These   medicines are only used for a short time.  Using medicines to relieve a runny nose. These may include antihistamine medicines or anticholinergic nasal sprays.  Surgery to remove tissue from inside the nose may be needed in severe cases if the condition has not improved  after 6-12 months of medical treatment. Follow these instructions at home:  Take or use over-the-counter and prescription medicines only as told by your health care provider. Do not stop using your medicine even if you start to feel better.  Use salt-water (saline) rinses or other solutions (nasal washes or irrigations) to wash or rinse out the inside of your nose as told by your health care provider.  Do not take NSAIDs or medicines that contain aspirin if they make your symptoms worse.  Do not drink alcohol if it makes your symptoms worse.  Do not use any tobacco products, such as cigarettes, chewing tobacco, and e-cigarettes. If you need help quitting, ask your health care provider.  Avoid secondhand smoke.  Get some exercise every day. Exercise may help reduce symptoms of nonallergic rhinitis for some people. Ask your health care provider how much exercise and what types of exercise are safe for you.  Sleep with the head of your bed raised (elevated). This may reduce nighttime nasal congestion.  Keep all follow-up visits as told by your health care provider. This is important. Contact a health care provider if:  You have a fever.  Your symptoms are getting worse at home.  Your symptoms are not responding to medicine.  You develop new symptoms, especially a headache or nosebleed. This information is not intended to replace advice given to you by your health care provider. Make sure you discuss any questions you have with your health care provider. Document Released: 04/30/2015 Document Revised: 06/14/2015 Document Reviewed: 03/29/2015 Elsevier Interactive Patient Education  2018 [employer]. Allergic Rhinitis, Adult Allergic rhinitis is an allergic reaction that affects the mucous membrane inside the nose. It causes sneezing, a runny or stuffy nose, and the feeling of mucus going down the back of the throat (postnasal drip). Allergic rhinitis can be mild to severe. There are  two types of allergic rhinitis:  Seasonal. This type is also called hay fever. It happens only during certain seasons.  Perennial. This type can happen at any time of the year.  What are the causes? This condition happens when the body's defense system (immune system) responds to certain harmless substances called allergens as though they were germs.  Seasonal allergic rhinitis is triggered by pollen, which can come from grasses, trees, and weeds. Perennial allergic rhinitis may be caused by:  House dust mites.  Pet dander.  Mold spores.  What are the signs or symptoms? Symptoms of this condition include:  Sneezing.  Runny or stuffy nose (nasal congestion).  Postnasal drip.  Itchy nose.  Tearing of the eyes.  Trouble sleeping.  Daytime sleepiness.  How is this diagnosed? This condition may be diagnosed based on:  Your medical history.  A physical exam.  Tests to check for related conditions, such as: ? Asthma. ? Pink eye. ? Ear infection. ? Upper respiratory infection.  Tests to find out which allergens trigger your symptoms. These may include skin or blood tests.  How is this treated? There is no cure for this condition, but treatment can help control symptoms. Treatment may include:  Taking medicines that block allergy symptoms, such as antihistamines. Medicine may be given as a shot, nasal spray, or pill.  Avoiding  the allergen.  Desensitization. This treatment involves getting ongoing shots until your body becomes less sensitive to the allergen. This treatment may be done if other treatments do not help.  If taking medicine and avoiding the allergen does not work, new, stronger medicines may be prescribed.  Follow these instructions at home:  Find out what you are allergic to. Common allergens include smoke, dust, and pollen.  Avoid the things you are allergic to. These are some things you can do to help avoid allergens: ? Replace carpet with wood,  tile, or vinyl flooring. Carpet can trap dander and dust. ? Do not smoke. Do not allow smoking in your home. ? Change your heating and air conditioning filter at least once a month. ? During allergy season:  Keep windows closed as much as possible.  Plan outdoor activities when pollen counts are lowest. This is usually during the evening hours.  When coming indoors, change clothing and shower before sitting on furniture or bedding.  Take over-the-counter and prescription medicines only as told by your health care provider.  Keep all follow-up visits as told by your health care provider. This is important. Contact a health care provider if:  You have a fever.  You develop a persistent cough.  You make whistling sounds when you breathe (you wheeze).  Your symptoms interfere with your normal daily activities. Get help right away if:  You have shortness of breath. Summary  This condition can be managed by taking medicines as directed and avoiding allergens.  Contact your health care provider if you develop a persistent cough or fever.  During allergy season, keep windows closed as much as possible. This information is not intended to replace advice given to you by your health care provider. Make sure you discuss any questions you have with your health care provider. Document Released: 10/01/2000 Document Revised: 02/14/2016 Document Reviewed: 02/14/2016 Elsevier Interactive Patient Education  2018 [employer]. Sinusitis, Adult Sinusitis is soreness and inflammation of your sinuses. Sinuses are hollow spaces in the bones around your face. Your sinuses are located:  Around your eyes.  In the middle of your forehead.  Behind your nose.  In your cheekbones.  Your sinuses and nasal passages are lined with a stringy fluid (mucus). Mucus normally drains out of your sinuses. When your nasal tissues become inflamed or swollen, the mucus can become trapped or blocked so air cannot  flow through your sinuses. This allows bacteria, viruses, and funguses to grow, which leads to infection. Sinusitis can develop quickly and last for 7?10 days (acute) or for more than 12 weeks (chronic). Sinusitis often develops after a cold. What are the causes? This condition is caused by anything that creates swelling in the sinuses or stops mucus from draining, including:  Allergies.  Asthma.  Bacterial or viral infection.  Abnormally shaped bones between the nasal passages.  Nasal growths that contain mucus (nasal polyps).  Narrow sinus openings.  Pollutants, such as chemicals or irritants in the air.  A foreign object stuck in the nose.  A fungal infection. This is rare.  What increases the risk? The following factors may make you more likely to develop this condition:  Having allergies or asthma.  Having had a recent cold or respiratory tract infection.  Having structural deformities or blockages in your nose or sinuses.  Having a weak immune system.  Doing a lot of swimming or diving.  Overusing nasal sprays.  Smoking.  What are the signs or symptoms? The main  symptoms of this condition are pain and a feeling of pressure around the affected sinuses. Other symptoms include:  Upper toothache.  Earache.  Headache.  Bad breath.  Decreased sense of smell and taste.  A cough that may get worse at night.  Fatigue.  Fever.  Thick drainage from your nose. The drainage is often green and it may contain pus (purulent).  Stuffy nose or congestion.  Postnasal drip. This is when extra mucus collects in the throat or back of the nose.  Swelling and warmth over the affected sinuses.  Sore throat.  Sensitivity to light.  How is this diagnosed? This condition is diagnosed based on symptoms, a medical history, and a physical exam. To find out if your condition is acute or chronic, your health care provider may:  Look in your nose for signs of nasal  polyps.  Tap over the affected sinus to check for signs of infection.  View the inside of your sinuses using an imaging device that has a light attached (endoscope).  If your health care provider suspects that you have chronic sinusitis, you may also:  Be tested for allergies.  Have a sample of mucus taken from your nose (nasal culture) and checked for bacteria.  Have a mucus sample examined to see if your sinusitis is related to an allergy.  If your sinusitis does not respond to treatment and it lasts longer than 8 weeks, you may have an MRI or CT scan to check your sinuses. These scans also help to determine how severe your infection is. In rare cases, a bone biopsy may be done to rule out more serious types of fungal sinus disease. How is this treated? Treatment for sinusitis depends on the cause and whether your condition is chronic or acute. If a virus is causing your sinusitis, your symptoms will go away on their own within 10 days. You may be given medicines to relieve your symptoms, including:  Topical nasal decongestants. They shrink swollen nasal passages and let mucus drain from your sinuses.  Antihistamines. These drugs block inflammation that is triggered by allergies. This can help to ease swelling in your nose and sinuses.  Topical nasal corticosteroids. These are nasal sprays that ease inflammation and swelling in your nose and sinuses.  Nasal saline washes. These rinses can help to get rid of thick mucus in your nose.  If your condition is caused by bacteria, you will be given an antibiotic medicine. If your condition is caused by a fungus, you will be given an antifungal medicine. Surgery may be needed to correct underlying conditions, such as narrow nasal passages. Surgery may also be needed to remove polyps. Follow these instructions at home: Medicines  Take, use, or apply over-the-counter and prescription medicines only as told by your health care provider. These  may include nasal sprays.  If you were prescribed an antibiotic medicine, take it as told by your health care provider. Do not stop taking the antibiotic even if you start to feel better. Hydrate and Humidify  Drink enough water to keep your urine clear or pale yellow. Staying hydrated will help to thin your mucus.  Use a cool mist humidifier to keep the humidity level in your home above 50%.  Inhale steam for 10-15 minutes, 3-4 times a day or as told by your health care provider. You can do this in the bathroom while a hot shower is running.  Limit your exposure to cool or dry air. Rest  Rest as much  as possible.  Sleep with your head raised (elevated).  Make sure to get enough sleep each night. General instructions  Apply a warm, moist washcloth to your face 3-4 times a day or as told by your health care provider. This will help with discomfort.  Wash your hands often with soap and water to reduce your exposure to viruses and other germs. If soap and water are not available, use hand sanitizer.  Do not smoke. Avoid being around people who are smoking (secondhand smoke).  Keep all follow-up visits as told by your health care provider. This is important. Contact a health care provider if:  You have a fever.  Your symptoms get worse.  Your symptoms do not improve within 10 days. Get help right away if:  You have a severe headache.  You have persistent vomiting.  You have pain or swelling around your face or eyes.  You have vision problems.  You develop confusion.  Your neck is stiff.  You have trouble breathing. This information is not intended to replace advice given to you by your health care provider. Make sure you discuss any questions you have with your health care provider. Document Released: 01/06/2005 Document Revised: 09/02/2015 Document Reviewed: 11/01/2014 Elsevier Interactive Patient Education  2018 [employer]. Otitis Media, Adult Otitis media  occurs when there is inflammation and fluid in the middle ear. Your middle ear is a part of the ear that contains bones for hearing as well as air that helps send sounds to your brain. What are the causes? This condition is caused by a blockage in the eustachian tube. This tube drains fluid from the ear to the back of the nose (nasopharynx). A blockage in this tube can be caused by an object or by swelling (edema) in the tube. Problems that can cause a blockage include:  A cold or other upper respiratory infection.  Allergies.  An irritant, such as tobacco smoke.  Enlarged adenoids. The adenoids are areas of soft tissue located high in the back of the throat, behind the nose and the roof of the mouth.  A mass in the nasopharynx.  Damage to the ear caused by pressure changes (barotrauma).  What are the signs or symptoms? Symptoms of this condition include:  Ear pain.  A fever.  Decreased hearing.  A headache.  Tiredness (lethargy).  Fluid leaking from the ear.  Ringing in the ear.  How is this diagnosed? This condition is diagnosed with a physical exam. During the exam your health care provider will use an instrument called an otoscope to look into your ear and check for redness, swelling, and fluid. He or she will also ask about your symptoms. Your health care provider may also order tests, such as:  A test to check the movement of the eardrum (pneumatic otoscopy). This test is done by squeezing a small amount of air into the ear.  A test that changes air pressure in the middle ear to check how well the eardrum moves and whether the eustachian tube is working (tympanogram).  How is this treated? This condition usually goes away on its own within 3-5 days. But if the condition is caused by a bacteria infection and does not go away own its own, or keeps coming back, your health care provider may:  Prescribe antibiotic medicines to treat the infection.  Prescribe or  recommend medicines to control pain.  Follow these instructions at home:  Take over-the-counter and prescription medicines only as told by your  health care provider.  If you were prescribed an antibiotic medicine, take it as told by your health care provider. Do not stop taking the antibiotic even if you start to feel better.  Keep all follow-up visits as told by your health care provider. This is important. Contact a health care provider if:  You have bleeding from your nose.  There is a lump on your neck.  You are not getting better in 5 days.  You feel worse instead of better. Get help right away if:  You have severe pain that is not controlled with medicine.  You have swelling, redness, or pain around your ear.  You have stiffness in your neck.  A part of your face is paralyzed.  The bone behind your ear (mastoid) is tender when you touch it.  You develop a severe headache. Summary  Otitis media is redness, soreness, and swelling of the middle ear.  This condition usually goes away on its own within 3-5 days.  If the problem does not go away in 3-5 days, your health care provider may prescribe or recommend medicines to treat your symptoms.  If you were prescribed an antibiotic medicine, take it as told by your health care provider. This information is not intended to replace advice given to you by your health care provider. Make sure you discuss any questions you have with your health care provider. Document Released: 10/12/2003 Document Revised: 12/28/2015 Document Reviewed: 12/28/2015 Elsevier Interactive Patient Education  2018 [employer] Sinus Rinse What is a sinus rinse? A sinus rinse is a simple home treatment that is used to rinse your sinuses with a sterile mixture of salt and water (saline solution). Sinuses are air-filled spaces in your skull behind the bones of your face and forehead that open into your nasal cavity. You will use the  following:  Saline solution.  Neti pot or spray bottle. This releases the saline solution into your nose and through your sinuses. Neti pots and spray bottles can be purchased at [employer], a health food store, or online.  When would I do a sinus rinse? A sinus rinse can help to clear mucus, dirt, dust, or pollen from the nasal cavity. You may do a sinus rinse when you have a cold, a virus, nasal allergy symptoms, a sinus infection, or stuffiness in the nose or sinuses. If you are considering a sinus rinse:  Ask your child's health care provider before performing a sinus rinse on your child.  Do not do a sinus rinse if you have had ear or nasal surgery, ear infection, or blocked ears.  How do I do a sinus rinse?  Wash your hands.  Disinfect your device according to the directions provided and then dry it.  Use the solution that comes with your device or one that is sold separately in stores. Follow the mixing directions on the package.  Fill your device with the amount of saline solution as directed by the device instructions.  Stand over a sink and tilt your head sideways over the sink.  Place the spout of the device in your upper nostril (the one closer to the ceiling).  Gently pour or squeeze the saline solution into the nasal cavity. The liquid should drain to the lower nostril if you are not overly congested.  Gently blow your nose. Blowing too hard may cause ear pain.  Repeat in the other nostril.  Clean and rinse your device with clean water and then air-dry it. Are  there risks of a sinus rinse? Sinus rinse is generally very safe and effective. However, there are a few risks, which include:  A burning sensation in the sinuses. This may happen if you do not make the saline solution as directed. Make sure to follow all directions when making the saline solution.  Infection from contaminated water. This is rare, but possible.  Nasal irritation.  This  information is not intended to replace advice given to you by your health care provider. Make sure you discuss any questions you have with your health care provider. Document Released: 08/03/2013 Document Revised: 12/04/2015 Document Reviewed: 05/24/2013 Elsevier Interactive Patient Education  2017 [employer].

## 2018-01-28 ENCOUNTER — Telehealth: Payer: Self-pay | Admitting: Registered Nurse

## 2018-01-28 ENCOUNTER — Encounter: Payer: Self-pay | Admitting: Registered Nurse

## 2018-01-28 MED ORDER — FEXOFENADINE HCL 180 MG PO TABS: 180.0000 mg | ORAL_TABLET | Freq: Every day | ORAL | 3 refills | Status: DC

## 2018-01-28 NOTE — Telephone Encounter (Signed)
Patient requested refill of allegra 180mg  po daily #90 RF3 as she is out of refills for [employer] funds use.  Okay on singulair and flonase at this time.  Electronic Rx sent to her [facility] of choice on [address] in [place], [place]

## 2018-02-23 ENCOUNTER — Ambulatory Visit: Payer: Self-pay | Admitting: *Deleted

## 2018-02-23 ENCOUNTER — Ambulatory Visit: Payer: Self-pay | Admitting: Registered Nurse

## 2018-02-23 ENCOUNTER — Encounter: Payer: Self-pay | Admitting: Registered Nurse

## 2018-02-23 VITALS — BP 71/42 | HR 66 | Temp 99.3°F

## 2018-02-23 VITALS — BP 71/42 | HR 66

## 2018-02-23 DIAGNOSIS — Z0000 Encounter for general adult medical examination without abnormal findings: Secondary | ICD-10-CM

## 2018-02-23 DIAGNOSIS — B351 Tinea unguium: Secondary | ICD-10-CM

## 2018-02-23 MED ORDER — CICLOPIROX 8 % EX SOLN: Freq: Every day | CUTANEOUS | 1 refills | Status: AC

## 2018-02-23 NOTE — Progress Notes (Signed)
Subjective:    Patient ID: Madison Garner, female    DOB: 12-22-1970, 48 y.o.   MRN: [id number]  47y/o married caucasian female established patient here for evaluation left 2nd toe nail thickened, nonadherent "I had a blister underneath it from it rubbing in my shoe earlier this year"  "It hasn't seemed to get better"  "I am wondering if infected fungal"  Looks bruised under nail  Also here for [facility] Well lab draw has been fasting overnight.  A little dizzy with quick position changes this morning hasn't eaten or drank anything.     Review of Systems  Constitutional: Negative for activity change, appetite change, chills, diaphoresis, fatigue and fever.  HENT: Negative for congestion, ear discharge, ear pain, facial swelling, hearing loss, mouth sores, nosebleeds, postnasal drip, rhinorrhea, sinus pressure, sinus pain, sneezing, sore throat, tinnitus, trouble swallowing and voice change.   Eyes: Negative for photophobia and visual disturbance.  Respiratory: Negative for cough, shortness of breath, wheezing and stridor.   Gastrointestinal: Negative for diarrhea, nausea and vomiting.  Endocrine: Negative for cold intolerance and heat intolerance.  Genitourinary: Negative for difficulty urinating.  Musculoskeletal: Positive for myalgias. Negative for arthralgias, back pain, gait problem, joint swelling, neck pain and neck stiffness.  Skin: Positive for color change. Negative for wound.  Allergic/Immunologic: Positive for environmental allergies. Negative for food allergies.  Neurological: Positive for dizziness. Negative for tremors, seizures, syncope, facial asymmetry, speech difficulty, weakness, light-headedness, numbness and headaches.  Psychiatric/Behavioral: Negative for confusion and sleep disturbance. The patient is not nervous/anxious.        Objective:   Physical Exam Vitals signs reviewed.  Constitutional:      General: She is not in acute distress.    Appearance: Normal  appearance. She is well-developed. She is not ill-appearing, toxic-appearing or diaphoretic.  HENT:     Head: Normocephalic and atraumatic.     Right Ear: External ear normal.     Left Ear: External ear normal.     Nose: Nose normal. No congestion or rhinorrhea.     Mouth/Throat:     Mouth: Mucous membranes are moist.     Pharynx: No oropharyngeal exudate or posterior oropharyngeal erythema.  Eyes:     General: Lids are normal.     Extraocular Movements: Extraocular movements intact.     Conjunctiva/sclera: Conjunctivae normal.     Pupils: Pupils are equal, round, and reactive to light.  Neck:     Musculoskeletal: Normal range of motion and neck supple. No neck rigidity or muscular tenderness.     Trachea: Trachea normal.  Cardiovascular:     Rate and Rhythm: Normal rate and regular rhythm.     Pulses: Normal pulses.     Heart sounds: Normal heart sounds.  Pulmonary:     Effort: Pulmonary effort is normal. No respiratory distress.     Breath sounds: Normal breath sounds. No stridor. No wheezing, rhonchi or rales.  Abdominal:     Palpations: Abdomen is soft.  Musculoskeletal: Normal range of motion.        General: No swelling, tenderness or signs of injury.     Right lower leg: No edema.     Left lower leg: No edema.  Lymphadenopathy:     Cervical: No cervical adenopathy.  Skin:    General: Skin is warm and dry.     Capillary Refill: Capillary refill takes less than 2 seconds.     Coloration: Skin is not ashen, cyanotic, jaundiced, mottled, pale or sallow.  Findings: Ecchymosis present. No abrasion, abscess, acne, bruising, erythema, laceration, lesion, petechiae, rash or wound. Rash is not crusting, macular, nodular, papular, purpuric, pustular, scaling, urticarial or vesicular.     Nails: There is no clubbing.      Comments: Left 2nd central ecchymosis and distal nail thickened ridged distally nonadherent opaque brittle; skin distal toe dry fine scale no discharge,  fluctuance, erythema  Neurological:     General: No focal deficit present.     Mental Status: She is alert and oriented to person, place, and time. Mental status is at baseline.     Cranial Nerves: No cranial nerve deficit.     Sensory: No sensory deficit.     Motor: No weakness.     Coordination: Coordination normal.     Gait: Gait normal.  Psychiatric:        Mood and Affect: Mood normal.        Speech: Speech normal.        Behavior: Behavior normal.        Thought Content: Thought content normal.        Judgment: Judgment normal.    Left 2nd toenail thickened yellow and nonadherent distal; ecchymosis under mid nail slightly ttp no erythema       Assessment & Plan:  A-onychomycosis left 2nd nail initial encounter, mild dehydration  P-penlac rx to her pharmacy of choice electronically apply topical daily and after 7 days remove with rubbing alcohol.  Wait 8 hours after washing feet to apply.  #1 RF1  Will requires months of therapy.  Discussed antifungal powder in footwear may be helpful along with rotating footwear and placing footwear in sun to dry out between use.  Exitcare handout on onychomycosis given to patient.  Keep nails cut short to avoid repetitive pounding in shoes.  Patient verbalized understanding and agreed with plan of care and had no further questions at this time.  SAR discussed ensure doing spring routine to prevent sinusitis Patient may use normal saline nasal spray 2 sprays each nostril q2h wa as needed. flonase 50mcg 1 spray each nostril BID #1 RF0.  Patient denied personal or family history of ENT cancer.  OTC antihistamine of choice claritin/zyrtec 10mg  po daily.  Avoid triggers if possible.  Shower prior to bedtime if exposed to triggers.  If allergic dust/dust mites recommend mattress/pillow covers/encasements; washing linens, vacuuming, sweeping, dusting weekly.  Call or return to clinic as needed if these symptoms worsen or fail to improve as anticipated.     Patient verbalized understanding of instructions, agreed with plan of care and had no further questions at this time.  P2:  Avoidance and hand washing.  Fasting for be well labs drawn today discussed eat breakfast and drink full glass of water as soon as she leaves clinic regular meals and water to keep urine clear yellow tinged urinating every 4-6 hours.  Patient verbalized understanding information/instructions, agreed with plan of care and had no further questions at this time.  I followed up with patient two hours after lab draw and she reported she had eaten and exercised dizzyness resolved

## 2018-02-23 NOTE — Patient Instructions (Signed)
Fungal Nail Infection A fungal nail infection is a common infection of the toenails or fingernails. This condition affects toenails more often than fingernails. It often affects the great, or big, toes. More than one nail may be infected. The condition can be passed from person to person (is contagious). What are the causes? This condition is caused by a fungus. Several types of fungi can cause the infection. These fungi are common in moist and warm areas. If your hands or feet come into contact with the fungus, it may get into a crack in your fingernail or toenail and cause the infection. What increases the risk? The following factors may make you more likely to develop this condition:  Being female.  Being of older age.  Living with someone who has the fungus.  Walking barefoot in areas where the fungus thrives, such as showers or locker rooms.  Wearing shoes and socks that cause your feet to sweat.  Having a nail injury or a recent nail surgery.  Having certain medical conditions, such as: ? Athlete's foot. ? Diabetes. ? Psoriasis. ? Poor circulation. ? A weak body defense system (immune system). What are the signs or symptoms? Symptoms of this condition include:  A pale spot on the nail.  Thickening of the nail.  A nail that becomes yellow or brown.  A brittle or ragged nail edge.  A crumbling nail.  A nail that has lifted away from the nail bed. How is this diagnosed? This condition is diagnosed with a physical exam. Your health care provider may take a scraping or clipping from your nail to test for the fungus. How is this treated? Treatment is not needed for mild infections. If you have significant nail changes, treatment may include:  Antifungal medicines taken by mouth (orally). You may need to take the medicine for several weeks or several months, and you may not see the results for a long time. These medicines can cause side effects. Ask your health care provider  what problems to watch for.  Antifungal nail polish or nail cream. These may be used along with oral antifungal medicines.  Laser treatment of the nail.  Surgery to remove the nail. This may be needed for the most severe infections. It can take a long time, usually up to a year, for the infection to go away. The infection may also come back. Follow these instructions at home: Medicines  Take or apply over-the-counter and prescription medicines only as told by your health care provider.  Ask your health care provider about using over-the-counter mentholated ointment on your nails. Nail care  Trim your nails often.  Wash and dry your hands and feet every day.  Keep your feet dry: ? Wear absorbent socks, and change your socks frequently. ? Wear shoes that allow air to circulate, such as sandals or canvas tennis shoes. Throw out old shoes.  Do not use artificial nails.  If you go to a nail salon, make sure you choose one that uses clean instruments.  Use antifungal foot powder on your feet and in your shoes. General instructions  Do not share personal items, such as towels or nail clippers.  Do not walk barefoot in shower rooms or locker rooms.  Wear rubber gloves if you are working with your hands in wet areas.  Keep all follow-up visits as told by your health care provider. This is important. Contact a health care provider if: Your infection is not getting better or it is getting worse   after several months. Summary  A fungal nail infection is a common infection of the toenails or fingernails.  Treatment is not needed for mild infections. If you have significant nail changes, treatment may include taking medicine orally and applying medicine to your nails.  It can take a long time, usually up to a year, for the infection to go away. The infection may also come back.  Take or apply over-the-counter and prescription medicines only as told by your health care  provider.  Follow instructions for taking care of your nails to help prevent infection from coming back or spreading. This information is not intended to replace advice given to you by your health care provider. Make sure you discuss any questions you have with your health care provider. Document Released: 01/04/2000 Document Revised: 06/12/2017 Document Reviewed: 06/12/2017 Elsevier Interactive Patient Education  2019 Elsevier Inc.  

## 2018-02-23 NOTE — Progress Notes (Signed)
Be Well insurance premium discount evaluation: Labs Drawn by [facility] NP. [employer] ROI form and Tobacco Free Attestation form to be signed at result review 2/6.  Ht/Wt to be obtained at result review as well.

## 2018-02-24 LAB — CMP12+LP+TP+TSH+6AC+CBC/D/PLT
ALT: 11 IU/L (ref 0–32)
AST: 15 IU/L (ref 0–40)
Albumin/Globulin Ratio: 1.8 (ref 1.2–2.2)
Albumin: 4.4 g/dL (ref 3.8–4.8)
Alkaline Phosphatase: 62 IU/L (ref 39–117)
BUN/Creatinine Ratio: 19 (ref 9–23)
BUN: 13 mg/dL (ref 6–24)
Basophils Absolute: 0 10*3/uL (ref 0.0–0.2)
Basos: 1 %
Bilirubin Total: 0.5 mg/dL (ref 0.0–1.2)
CHLORIDE: 102 mmol/L (ref 96–106)
Calcium: 9.3 mg/dL (ref 8.7–10.2)
Chol/HDL Ratio: 3.1 ratio (ref 0.0–4.4)
Cholesterol, Total: 211 mg/dL — ABNORMAL HIGH (ref 100–199)
Creatinine, Ser: 0.69 mg/dL (ref 0.57–1.00)
EOS (ABSOLUTE): 0.1 10*3/uL (ref 0.0–0.4)
Eos: 2 %
Estimated CHD Risk: <0.5 times avg. (ref 0.0–1.0)
Free Thyroxine Index: 1.4 (ref 1.2–4.9)
GFR calc Af Amer: 121 mL/min/{1.73_m2} (ref >59)
GFR, EST NON AFRICAN AMERICAN: 105 mL/min/{1.73_m2} (ref >59)
GGT: 9 IU/L (ref 0–60)
Globulin, Total: 2.4 g/dL (ref 1.5–4.5)
Glucose: 78 mg/dL (ref 65–99)
HDL: 68 mg/dL (ref >39)
Hematocrit: 38.2 % (ref 34.0–46.6)
Hemoglobin: 12.6 g/dL (ref 11.1–15.9)
IRON: 148 ug/dL (ref 27–159)
Immature Grans (Abs): 0 10*3/uL (ref 0.0–0.1)
Immature Granulocytes: 0 %
LDH: 185 IU/L (ref 119–226)
LDL Calculated: 131 mg/dL — ABNORMAL HIGH (ref 0–99)
Lymphocytes Absolute: 1.7 10*3/uL (ref 0.7–3.1)
Lymphs: 29 %
MCH: 29.7 pg (ref 26.6–33.0)
MCHC: 33 g/dL (ref 31.5–35.7)
MCV: 90 fL (ref 79–97)
Monocytes Absolute: 0.5 10*3/uL (ref 0.1–0.9)
Monocytes: 9 %
Neutrophils Absolute: 3.4 10*3/uL (ref 1.4–7.0)
Neutrophils: 59 %
Phosphorus: 3 mg/dL (ref 3.0–4.3)
Platelets: 317 10*3/uL (ref 150–450)
Potassium: 4.3 mmol/L (ref 3.5–5.2)
RBC: 4.24 x10E6/uL (ref 3.77–5.28)
RDW: 11.9 % (ref 11.7–15.4)
Sodium: 139 mmol/L (ref 134–144)
T3 UPTAKE RATIO: 22 % — AB (ref 24–39)
T4, Total: 6.2 ug/dL (ref 4.5–12.0)
TSH: 2.09 u[IU]/mL (ref 0.450–4.500)
Total Protein: 6.8 g/dL (ref 6.0–8.5)
Triglycerides: 59 mg/dL (ref 0–149)
Uric Acid: 3.9 mg/dL (ref 2.5–7.1)
VLDL Cholesterol Cal: 12 mg/dL (ref 5–40)
WBC: 5.7 10*3/uL (ref 3.4–10.8)

## 2018-02-24 LAB — HGB A1C W/O EAG: Hgb A1c MFr Bld: 4.8 % (ref 4.8–5.6)

## 2018-02-25 NOTE — Progress Notes (Signed)
BMI 31, be well forms signed, noted regarding exercise program recently scaled back encouraged weight loss/maintainence low impact exercises.  BP slightly elevated routine follow up with PCM.

## 2018-04-05 ENCOUNTER — Other Ambulatory Visit: Payer: Self-pay | Admitting: *Deleted

## 2018-04-05 MED ORDER — FLUTICASONE PROPIONATE 50 MCG/ACT NA SUSP: 1.0000 | Freq: Two times a day (BID) | NASAL | 6 refills | Status: DC

## 2018-11-15 ENCOUNTER — Other Ambulatory Visit: Payer: Self-pay | Admitting: *Deleted

## 2018-11-15 DIAGNOSIS — J301 Allergic rhinitis due to pollen: Secondary | ICD-10-CM

## 2018-11-15 MED ORDER — PSEUDOEPHEDRINE HCL 30 MG PO TABS: 30.0000 mg | ORAL_TABLET | ORAL | 1 refills | Status: DC | PRN

## 2018-11-15 MED ORDER — LORATADINE 10 MG PO TABS: 10.0000 mg | ORAL_TABLET | Freq: Every day | ORAL | 3 refills | Status: DC

## 2018-11-15 NOTE — Telephone Encounter (Signed)
Pt requesting annual refill of seasonal allergy medications. Requests Pseudophed and antihistamine. Requests to switch from Fexofenadine to Loratadine 2/2 cost savings. Pt does not need new Rx for Flonase currently and has not taken Singulair in a few months as she has not needed it. Sts she still has about 30 tabs at home. Advised her due to weather changes recently, she may find she needs to restart it soon and to alert clinic if/when she needs a new Rx. She verbalizes agreement. Refills sent to preferred pharmacy.

## 2018-11-26 ENCOUNTER — Other Ambulatory Visit: Payer: Self-pay

## 2018-11-26 ENCOUNTER — Ambulatory Visit: Payer: Self-pay | Admitting: *Deleted

## 2018-11-26 VITALS — BP 122/86 | HR 68 | Temp 98.2°F

## 2018-11-26 DIAGNOSIS — H6593 Unspecified nonsuppurative otitis media, bilateral: Secondary | ICD-10-CM

## 2018-11-26 MED ORDER — AMOXICILLIN-POT CLAVULANATE 875-125 MG PO TABS: 1.0000 | ORAL_TABLET | Freq: Two times a day (BID) | ORAL | 0 refills | Status: AC

## 2018-11-26 NOTE — Progress Notes (Signed)
48y/o Caucasian established female pt c/o bilateral otalgia, R>L, nasal congestion and PND. Denies cough, chest congestion, HA, sinus pain/pressure. RN saw pt one week ago and recommended she restart her Flonase, Singulair, nasal saline spray and start Sudafed prn, along with her daily allegra. She reports she is doing all of this for past week but otalgia has worsened.  OME last Nov 2019, and Nov 2018. Treated with Augmentin both times from [facility] clinic with good results.   Upon exam, bilateral TMs are bulging, worse compared to previous exam on 11/19/18. Bilateral ear canals tender, especially with otoscope exam. R TM air fluid level clear with slight erythema at 12:00 position. L TM air fluid level cloudy. Cobblestoning posterior pharynx. L tonsil 2+, R tonsil 1+. Per pt this is improved from earlier in the week. No exudate noted.  Advised pt to shower BID especially after work, prior to bed. Continue to use saline spray as mist q2h throughout day, as well as nasal rinse in shower. Continue all other OTC meds for sx prn.  Case discussed with NP Betancourt over phone. Orders received for Augmentin 875mg  po BID x10days #20 RF0. Dispensed through [facility]. Pt to f/u with clinic next week if sx worsening or not improving. Pt has no further questions/concerns.

## 2019-10-17 ENCOUNTER — Telehealth: Payer: Self-pay | Admitting: *Deleted

## 2019-10-17 MED ORDER — FLUTICASONE PROPIONATE 50 MCG/ACT NA SUSP: 1.0000 | Freq: Two times a day (BID) | NASAL | 6 refills | Status: DC

## 2019-10-17 NOTE — Telephone Encounter (Signed)
Emailed request for [employer] with 3 month supply. Preferred pharmacy confirmed.

## 2019-10-20 ENCOUNTER — Telehealth: Payer: Self-pay | Admitting: *Deleted

## 2019-10-20 ENCOUNTER — Encounter: Payer: Self-pay | Admitting: *Deleted

## 2019-10-20 DIAGNOSIS — J301 Allergic rhinitis due to pollen: Secondary | ICD-10-CM

## 2019-10-20 MED ORDER — MONTELUKAST SODIUM 10 MG PO TABS: 10.0000 mg | ORAL_TABLET | Freq: Every day | ORAL | 3 refills | Status: DC

## 2019-10-20 NOTE — Telephone Encounter (Signed)
Pt in to clinic c/o bilateral eustachian tube discomfort for past 3 days. Denies sinus pain/pressure, PND, cough, rhinorrhea, nasal congestion. Has been using Pseudoephedrine, Claritin, Flonase, and sparingly, saline nasal spray at home. Advised to increase saline use. NP Inetta Fermo advised restarting Singulair qhs as pt moved to only prn use and has not used this recently. New Rx sent to pharmacy of choice. Pt to f/u in clinic next week if no improvement or with new sx.

## 2019-10-20 NOTE — Telephone Encounter (Signed)
I spoke with patient in clinic today also.  Signed electronic Rx singulair generic 10mg  po qhs #30 RF3 sent to her pharmacy of choice.  Increase nasal saline use 2 sprays each nostril q2h wa.  May continue sudafed use max 240mg  per 24 hours.  Patient wearing mask at work due to covid 19 pandemic.  Ensure new disposable mask daily or washing cloth masks and drying thoroughly after each daily use.  Follow up prn symptoms not improving or worsening with plan of care.

## 2019-10-23 NOTE — Telephone Encounter (Signed)
Patient reported pressure improved and ears just itchy now.  Discussed may use baby oil/mineral oil or olive oil a couple drops each ear canal if itching.  Do not put anything else in canals to scratch.  Patient verbalized understanding information/instructions, agreed with plan of care and had no further questions at this time.

## 2019-10-23 NOTE — Telephone Encounter (Signed)
Telephone message left for patient checking in to see if restarting singulair at bedtime helped with her symptoms or if any new symptoms this weekend.  Patient may email me at [email or web address] or call RN [facility] tomorrow [phone].

## 2020-02-02 ENCOUNTER — Telehealth: Payer: Self-pay | Admitting: *Deleted

## 2020-02-02 ENCOUNTER — Encounter: Payer: Self-pay | Admitting: *Deleted

## 2020-02-02 NOTE — Telephone Encounter (Signed)
Reviewed RN Haley note agree with plan of care. 

## 2020-02-02 NOTE — Telephone Encounter (Signed)
RN notified by HR that pt had alerted them to possible Covid exposures. She reported by email that one daughter lives at college and is having sx. She is trying to get in with campus health. Last exposure to that daughter was 1/7 and was not a close contact as the daughter was moving out of the house back to her dorm while pt worked in another room.   Another daughter that lives in the household was notified of an exposure from 1/6 and was notified 1/12 and has been quarantining separately. Pt has not been around that daughter since 1/7 and at that time it was a brief conversation at a distance of greater than 68ft. She has not developed any sx and is wearing a mask when outside of her own bedroom. Unknown date of when sx started for the person she was exposed to on 1/6.    No concern for close exposure for pt and is outside 5 day quarantine period anyway. Vaxxed x2. Overdue booster. Scheduled for 02/07/20.

## 2020-02-04 ENCOUNTER — Other Ambulatory Visit: Payer: Self-pay | Admitting: Registered Nurse

## 2020-02-04 ENCOUNTER — Encounter: Payer: Self-pay | Admitting: Registered Nurse

## 2020-02-04 DIAGNOSIS — J301 Allergic rhinitis due to pollen: Secondary | ICD-10-CM

## 2020-02-04 MED ORDER — SALINE SPRAY 0.65 % NA SOLN: 2.0000 | NASAL | 0 refills | Status: DC

## 2020-02-04 NOTE — Telephone Encounter (Signed)
Patient returned call and she has stopped allegra and using claritin again and would like new Rx sent in to [facility].  Discussed with patient electronic Rx sent today for her refills.  No further questions or concerns at this time.  Patient verbalized understanding information/instructions.

## 2020-02-04 NOTE — Telephone Encounter (Signed)
Received refill request for patient claritin 10mg  po daily #90 RF3.  Med list states on fexofenadine 180mg  po daily.  Telephone message left for patient clarifying which oral antihistamine she needs refill on  She can call [phone] but I do not have voicemail if on the line with another patient.  She can also email [email or web address]

## 2020-02-04 NOTE — Telephone Encounter (Signed)
Daughter #1 still pending test results and still having scratchy throat. This daughter staying with family of person who is also being tested for covid.   Daughter #2 everyone has tested negative in contact group and symptom free.  Still quarantining in house.  Patient asymptomatic and no questions or concerns at this time.  A&Ox3 respirations even and unlabored no cough/nasal congestion/throat clearing during telephone call 3 minutes.

## 2020-02-06 NOTE — Telephone Encounter (Signed)
Daughter #1 with positive test but cleared to return to campus. She was staying at a friends house and has had no contact with pt since 1/7.  Daughter #2 still asymptomatic. Was unable to get testing since exposure and is outside quarantine dates now. Her exposures have since tested negative.  Pt outside monitoring period. Cleared to receive booster 1/18 as planned.

## 2020-02-07 NOTE — Telephone Encounter (Signed)
Reviewed RN Haley note agree with plan of care. 

## 2020-02-10 ENCOUNTER — Other Ambulatory Visit: Payer: PRIVATE HEALTH INSURANCE

## 2020-02-10 ENCOUNTER — Telehealth: Payer: Self-pay | Admitting: *Deleted

## 2020-02-10 DIAGNOSIS — Z20822 Contact with and (suspected) exposure to covid-19: Secondary | ICD-10-CM

## 2020-02-10 NOTE — Telephone Encounter (Signed)
HR notified RN that pt was identified as a recent close contact of a positive coworker. Exposure date 02/03/20. She received her Covid booster vaccine on 1/18. She began having side effects/symptoms the evening of 1/18. Chills, body aches. Sx resolved 0300 Thurs 1/20.   Case discussed with NP Inetta Fermo in clinic yesterday. Since booster not effective for 2 weeks, pt still considered not boosted and requires quarantine for exposure. She has been working remote since 1/17. Unable to differentiate side effects vs possible sx from exposure, so testing indicated.  Test scheduled for today at 1400 at [facility] thru [facility]. Pt to email RN and HR if negative results come thru over the weekend for clearance to RTW on-site on Monday 02/13/20.   Exposure Day 0 02/03/20 Exposure Day 5 02/08/20 Possible sx Day 0 02/07/20 Possible sx Day 5 02/12/20

## 2020-02-11 ENCOUNTER — Encounter: Payer: Self-pay | Admitting: *Deleted

## 2020-02-11 NOTE — Telephone Encounter (Signed)
Reviewed RN Haley note agree with plan of care. 

## 2020-02-12 LAB — NOVEL CORONAVIRUS, NAA: SARS-CoV-2, NAA: NOT DETECTED

## 2020-02-12 LAB — SARS-COV-2, NAA 2 DAY TAT

## 2020-02-12 NOTE — Telephone Encounter (Signed)
Reviewed epic negative test result.  Patient contacted via telephone feeling well asymptomatic no fever/chills/vomiting/diarrhea in past 24 hours.  A&Ox3 spoke full sentences without difficulty no cough/throat clearing or nasal congestion/sniffing noted during 4 minute telephone call.  Discussed last day strict mask use tomorrow and no use employee lunch room tomorrow.  Eat in car or outside.  May use straw under mask but do not remove mask at work tomorrow.  Continue monitoring for symptoms and notify clinic staff if they occur.   Patient verbalized agreement and understanding of treatment plan and had no further questions at this time.  HR notified cleared to RTW 1/24 and strict mask use through 1/24/no employee lunch room use until 02/14/20.

## 2020-02-13 NOTE — Telephone Encounter (Signed)
Pt confirmed she did RTW today 1/24 as planned. No further needs or concerns. Closing encounter.

## 2020-02-13 NOTE — Telephone Encounter (Addendum)
Reviewed RN Rolly Salter note patient RTW as expected today last day of strict mask use today 02/13/2020

## 2020-02-28 ENCOUNTER — Encounter: Payer: Self-pay | Admitting: Registered Nurse

## 2020-02-28 ENCOUNTER — Other Ambulatory Visit: Payer: Self-pay

## 2020-02-28 ENCOUNTER — Ambulatory Visit: Payer: Self-pay | Admitting: Registered Nurse

## 2020-02-28 VITALS — BP 130/90 | HR 80 | Temp 98.5°F

## 2020-02-28 DIAGNOSIS — S29019A Strain of muscle and tendon of unspecified wall of thorax, initial encounter: Secondary | ICD-10-CM

## 2020-02-28 MED ORDER — BIOFREEZE 4 % EX GEL: 1.0000 "application " | Freq: Four times a day (QID) | CUTANEOUS | 0 refills | Status: AC | PRN

## 2020-02-28 MED ORDER — DICLOFENAC SODIUM 75 MG PO TBEC: 75.0000 mg | DELAYED_RELEASE_TABLET | Freq: Two times a day (BID) | ORAL | 0 refills | Status: AC

## 2020-02-28 NOTE — Patient Instructions (Addendum)
Thoracic Strain A thoracic strain, which is sometimes called a mid-back strain, is an injury to the muscles or tendons that attach to the upper part of your back behind your chest. This type of injury occurs when a muscle is overstretched or overloaded. Thoracic strains can range from mild to severe. Mild strains may involve stretching a muscle or tendon without tearing it. These injuries may heal in 1-2 weeks. More severe strains involve tearing of muscle fibers or tendons. These will cause more pain and may take 6-8 weeks to heal. What are the causes? This condition may be caused by:  Trauma, such as a fall or a hit to the body.  Twisting or overstretching the back. This may result from doing activities that require a lot of energy, such as lifting heavy objects. In some cases, the cause may not be known. What increases the risk? This injury is more common in:  Athletes.  People with obesity. What are the signs or symptoms? The main symptom of this condition is pain in the middle back, especially with movement. Other symptoms include:  Stiffness or limited range of motion.  Sudden muscle tightening (spasms). How is this diagnosed? This condition may be diagnosed based on:  Your symptoms.  Your medical history.  A physical exam.  Imaging tests, such as X-rays or an MRI. How is this treated? This condition may be treated with:  Resting the injured area.  Applying heat and cold to the injured area.  Over-the-counter medicines for pain and inflammation, such as NSAIDs.  Prescription pain medicine or muscle relaxants may be needed for a short time.  Physical therapy. This will involve doing stretching and strengthening exercises. Follow these instructions at home: Managing pain, stiffness, and swelling  If directed, put ice on the injured area. ? Put ice in a plastic bag. ? Place a towel between your skin and the bag. ? Leave the ice on for 20 minutes, 2-3 times a  day.  If directed, apply heat to the affected area as often as told by your health care provider. Use the heat source that your health care provider recommends, such as a moist heat pack or a heating pad. ? Place a towel between your skin and the heat source. ? Leave the heat on for 20-30 minutes. ? Remove the heat if your skin turns bright red. This is especially important if you are unable to feel pain, heat, or cold. You may have a greater risk of getting burned.      Activity  Rest and return to your normal activities as told by your health care provider. Ask your health care provider what activities are safe for you.  Do exercises as told by your health care provider. Medicines  Take over-the-counter and prescription medicines only as told by your health care provider.  Ask your health care provider if the medicine prescribed to you: ? Requires you to avoid driving or using heavy machinery. ? Can cause constipation. You may need to take these actions to prevent or treat constipation:  Drink enough fluid to keep your urine pale yellow.  Take over-the-counter or prescription medicines.  Eat foods that are high in fiber, such as beans, whole grains, and fresh fruits and vegetables.  Limit foods that are high in fat and processed sugars, such as fried or sweet foods. Injury prevention To prevent a future mid-back injury:  Always warm up properly before physical activity or sports.  Cool down and stretch after being active.  Use correct form when playing sports and lifting heavy objects. Bend your knees before you lift heavy objects.  Use good posture when sitting and standing.  Stay physically fit and maintain a healthy weight. ? Do at least 150 minutes of moderate-intensity exercise each week, such as brisk walking or water aerobics. ? Do strength exercises at least 2 times each week.   General instructions  Do not use any products that contain nicotine or tobacco, such  as cigarettes, e-cigarettes, and chewing tobacco. If you need help quitting, ask your health care provider.  Keep all follow-up visits as told by your health care provider. This is important. Contact a health care provider if:  Your pain is not helped by medicine.  Your pain or stiffness is getting worse.  You develop pain or stiffness in your neck or lower back. Get help right away if you:  Have shortness of breath.  Have chest pain.  Develop numbness or weakness in your legs or arms.  Have involuntary loss of urine (urinary incontinence). Summary  A thoracic strain, which is sometimes called a mid-back strain, is an injury to the muscles or tendons that attach to the upper part of your back behind your chest.  This type of injury occurs when a muscle is overstretched or overloaded.  Rest and return to your normal activities as told by your health care provider. If directed, apply heat or ice to the affected area as often as told by your health care provider.  Take over-the-counter and prescription medicines only as told by your health care provider.  Contact a health care provider if you have new or worsening symptoms. This information is not intended to replace advice given to you by your health care provider. Make sure you discuss any questions you have with your health care provider. Document Revised: 11/24/2017 Document Reviewed: 11/24/2017 Elsevier Patient Education  2021 [employer] Thoracic Strain Rehab Ask your health care provider which exercises are safe for you. Do exercises exactly as told by your health care provider and adjust them as directed. It is normal to feel mild stretching, pulling, tightness, or discomfort as you do these exercises. Stop right away if you feel sudden pain or your pain gets worse. Do not begin these exercises until told by your health care provider. Stretching and range-of-motion exercise This exercise warms up your muscles and joints  and improves the movement and flexibility of your back and shoulders. This exercise also helps to relieve pain. Chest and spine stretch 1. Lie down on your back on a firm surface. 2. Roll a towel or a small blanket so it is about 4 inches (10 cm) in diameter. 3. Put the towel lengthwise under the middle of your back so it is under your spine, but not under your shoulder blades. 4. Put your hands behind your head and let your elbows fall to your sides. This will increase your stretch. 5. Take a deep breath (inhale). 6. Hold for _____15_____ seconds. 7. Relax after you breathe out (exhale). Repeat _____3_____ times. Complete this exercise ___3_______ times a day.   Strengthening exercises These exercises build strength and endurance in your back and your shoulder blade muscles. Endurance is the ability to use your muscles for a long time, even after they get tired. Alternating arm and leg raises 1. Get on your hands and knees on a firm surface. If you are on a hard floor, you may want to use padding, such as an exercise mat, to  cushion your knees. 2. Line up your arms and legs. Your hands should be directly below your shoulders, and your knees should be directly below your hips. 3. Lift your left leg behind you. At the same time, raise your right arm and straighten it in front of you. ? Do not lift your leg higher than your hip. ? Do not lift your arm higher than your shoulder. ? Keep your abdominal and back muscles tight. ? Keep your hips facing the ground. ? Do not arch your back. ? Keep your balance carefully, and do not hold your breath. 4. Hold for ____15______ seconds. 5. Slowly return to the starting position and repeat with your right leg and your left arm. Repeat ______3____ times. Complete this exercise ___3_______ times a day.   Straight arm rows This exercise is also called shoulder extension exercise. 1. Stand with your feet shoulder width apart. 2. Secure an exercise band to a  stable object in front of you so the band is at or above shoulder height. 3. Hold one end of the exercise band in each hand. 4. Straighten your elbows and lift your hands up to shoulder height. 5. Step back, away from the secured end of the exercise band, until the band stretches. 6. Squeeze your shoulder blades together and pull your hands down to the sides of your thighs. Stop when your hands are straight down by your sides. This is shoulder extension. Do not let your hands go behind your body. 7. Hold for ____15______ seconds. 8. Slowly return to the starting position. Repeat ____3______ times. Complete this exercise ____3______ times a day.   Prone shoulder external rotation 1. Lie on your abdomen on a firm bed so your left / right forearm hangs over the edge of the bed and your upper arm is on the bed, straight out from your body. This is the prone position. ? Your elbow should be bent. ? Your palm should be facing your feet. 2. If instructed, hold a ____none______ weight in your hand. 3. Squeeze your shoulder blade toward the middle of your back. Do not let your shoulder lift toward your ear. 4. Keep your elbow bent in a 90-degree angle (right angle) while you slowly move your forearm up toward the ceiling. Move your forearm up to the height of the bed, toward your head. This is external rotation. ? Your upper arm should not move. ? At the top of the movement, your palm should face the floor. 5. Hold for ____15______ seconds. 6. Slowly return to the starting position and relax your muscles. Repeat _____3_____ times. Complete this exercise ____3______ times a day. Rowing scapular retraction This is an exercise in which the shoulder blades (scapulae) are pulled toward each other (retraction). 1. Sit in a stable chair without armrests, or stand up. 2. Secure an exercise band to a stable object in front of you so the band is at shoulder height. 3. Hold one end of the exercise band in each  hand. Your palms should face down. 4. Bring your arms out straight in front of you. 5. Step back, away from the secured end of the exercise band, until the band stretches. 6. Pull the band backward. As you do this, bend your elbows and squeeze your shoulder blades together, but avoid letting the rest of your body move. Do not shrug your shoulders upward while you do this. 7. Stop when your elbows are at your sides or slightly behind your body. 8. Hold for ___15_______  seconds. 9. Slowly straighten your arms to return to the starting position. Repeat _____3_____ times. Complete this exercise _____3CARPAL TUNNEL (Nerve Compression Syndrome): Eagle Arms    Bring right arm in front, elbow bent, forearm up. Reach left arm under right and, if possible, bring left fingers into right palm, thumbs facing body. (If not able to bring fingers into palm, just hold position where comfortable.) Hold position for _3-5__ breaths. Switch arms and repeat. Repeat _3__ times. Do __3_ times per day.  Copyright  VHI. All rights reserved.  _____ times a day.   Posture and body mechanics Good posture and healthy body mechanics can help to relieve stress in your body's tissues and joints. Body mechanics refers to the movements and positions of your body while you do your daily activities. Posture is part of body mechanics. Good posture means:  Your spine is in its natural S-curve position (neutral).  Your shoulders are pulled back slightly.  Your head is not tipped forward. Follow these guidelines to improve your posture and body mechanics in your everyday activities. Standing  When standing, keep your spine neutral and your feet about hip width apart. Keep a slight bend in your knees. Your ears, shoulders, and hips should line up with each other.  When you do a task in which you lean forward while standing in one place for a long time, place one foot up on a stable object that is 2-4 inches (5-10 cm) high, such  as a footstool. This helps keep your spine neutral.   Sitting  When sitting, keep your spine neutral and keep your feet flat on the floor. Use a footrest, if necessary, and keep your thighs parallel to the floor. Avoid rounding your shoulders, and avoid tilting your head forward.  When working at a desk or a computer, keep your desk at a height where your hands are slightly lower than your elbows. Slide your chair under your desk so you are close enough to maintain good posture.  When working at a computer, place your monitor at a height where you are looking straight ahead and you do not have to tilt your head forward or downward to look at the screen.   Resting When lying down and resting, avoid positions that are most painful for you.  If you have pain with activities such as sitting, bending, stooping, or squatting (flexion-basedactivities), lie in a position in which your body does not bend very much. For example, avoid curling up on your side with your arms and knees near your chest (fetal position).  If you have pain with activities such as standing for a long time or reaching with your arms (extension-basedactivities), lie with your spine in a neutral position and bend your knees slightly. Try the following positions: ? Lie on your side with a pillow between your knees. ? Lie on your back with a pillow under your knees.   Lifting  When lifting objects, keep your feet at least shoulder width apart and tighten your abdominal muscles.  Bend your knees and hips and keep your spine neutral. It is important to lift using the strength of your legs, not your back. Do not lock your knees straight out.  Always ask for help to lift heavy or awkward objects.   This information is not intended to replace advice given to you by your health care provider. Make sure you discuss any questions you have with your health care provider. Document Revised: 04/30/2018 Document Reviewed: 02/15/2018 Elsevier  Elsevier Patient Education  2021 Elsevier Inc.   

## 2020-02-28 NOTE — Progress Notes (Signed)
Subjective:    Patient ID: Madison Garner, female    DOB: [birth year], 50 y.o.   MRN: [id number]  49y/o Caucasian single established female pt c/o upper L sided back pain just medial to scapula. Reports pain started on drive home from work Friday, 4 days ago. 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 Pain is worse with deep breath, certain movements e.g. hunching back, trying to put on shoes/tie them, bend forward over plate to put fork in mouth. Has tried Aleve and Flexeril at home without relief. Aleve helped more than flexeril.  Was up and down all Friday/Saturday night unable to sleep due to pain (4-5 Feb)  Denied loss of bowel/bladder control, saddle paresthesias or arm/leg weakness.  Able to hold items over shoulder or read phone in front of her.  Denied decreased neck AROM.  Not established with chiropractor.  Has foam roller at home but hasn't tried using it for this problem.  Last advil dose last night.  Wearing pull on shoes today as could not tie shoes.     Review of Systems  Constitutional: Negative for activity change, appetite change, chills, diaphoresis, fatigue and fever.  HENT: Negative for trouble swallowing and voice change.   Eyes: Negative for photophobia and visual disturbance.  Respiratory: Negative for cough, choking, shortness of breath, wheezing and stridor.   Cardiovascular: Negative for palpitations.  Gastrointestinal: Negative for abdominal pain, nausea and vomiting.  Endocrine: Negative for cold intolerance and heat intolerance.  Genitourinary: Negative for difficulty urinating and enuresis.  Musculoskeletal: Positive for back pain and myalgias. Negative for arthralgias, gait problem, joint swelling, neck pain and neck stiffness.  Skin: Negative for color change, rash and wound.  Allergic/Immunologic: Positive for environmental allergies. Negative for food allergies.  Neurological: Negative for dizziness, tremors, seizures, syncope, facial asymmetry, speech  difficulty, weakness, light-headedness, numbness and headaches.  Hematological: Negative for adenopathy. Does not bruise/bleed easily.  Psychiatric/Behavioral: Positive for sleep disturbance. Negative for agitation and confusion.       Objective:   Physical Exam Vitals and nursing note reviewed.  Constitutional:      General: She is awake. She is not in acute distress.Vital signs are normal.     Appearance: Normal appearance. She is well-developed, well-groomed and well-nourished. She is not ill-appearing, toxic-appearing or diaphoretic.  HENT:     Head: Normocephalic and atraumatic.     Jaw: There is normal jaw occlusion.     Right Ear: External ear normal.     Left Ear: External ear normal.     Nose: Nose normal. No congestion or rhinorrhea.     Mouth/Throat:     Pharynx: Oropharynx is clear.  Eyes:     General: Lids are normal. Vision grossly intact. Gaze aligned appropriately. Allergic shiner present. No visual field deficit or scleral icterus.       Right eye: No discharge.        Left eye: No discharge.     Extraocular Movements: Extraocular movements intact and EOM normal.     Conjunctiva/sclera: Conjunctivae normal.     Pupils: Pupils are equal, round, and reactive to light.  Neck:     Trachea: Trachea and phonation normal. No tracheal deviation.  Cardiovascular:     Rate and Rhythm: Normal rate and regular rhythm.     Pulses: Normal pulses and intact distal pulses.          Carotid pulses are 2+ on the right side and 2+ on the left side.    Heart  sounds: Normal heart sounds.  Pulmonary:     Effort: Pulmonary effort is normal. No respiratory distress.     Breath sounds: Normal breath sounds and air entry. No stridor or transmitted upper airway sounds. No wheezing, rhonchi or rales.     Comments: Spoke full sentences without difficulty; no cough noted in exam room; wearing mask due to covid 19 pandemic Abdominal:     General: Abdomen is flat. There is no distension.      Palpations: Abdomen is soft.  Musculoskeletal:        General: Tenderness present. No swelling or deformity.     Right shoulder: No swelling, deformity, effusion, laceration, tenderness, bony tenderness or crepitus. Normal range of motion. Normal strength. Normal pulse.     Left shoulder: No swelling, deformity, effusion, laceration, tenderness, bony tenderness or crepitus. Normal range of motion. Normal strength. Normal pulse.     Right elbow: Normal.     Left elbow: Normal.     Right wrist: Normal.     Left wrist: Normal.     Right hand: Normal.     Left hand: Normal.       Arms:     Cervical back: Normal, normal range of motion and neck supple. No swelling, edema, deformity, erythema, signs of trauma, lacerations, rigidity, spasms, torticollis, tenderness, bony tenderness or crepitus. No pain with movement, spinous process tenderness or muscular tenderness. Normal range of motion.     Thoracic back: Tenderness present. No swelling, edema, deformity, signs of trauma, lacerations, spasms or bony tenderness. Decreased range of motion. No scoliosis.     Lumbar back: No swelling, edema, deformity, signs of trauma, lacerations, spasms, tenderness or bony tenderness. Normal range of motion. No scoliosis.       Back:     Right lower leg: No edema.     Left lower leg: No edema.     Comments: No paraspinal or scapular spasms noted but trigger point T4-5 paraspinal left; pain worsens with 15 degree flexion thorax improves with extension  Lymphadenopathy:     Head:     Right side of head: No submental or preauricular adenopathy.     Left side of head: No submental or preauricular adenopathy.     Cervical: No cervical adenopathy.     Right cervical: No superficial cervical adenopathy.    Left cervical: No superficial cervical adenopathy.  Skin:    General: Skin is warm, dry and intact.     Capillary Refill: Capillary refill takes less than 2 seconds.     Coloration: Skin is not ashen, cyanotic,  jaundiced, mottled, pale or sallow.     Findings: No abrasion, abscess, acne, bruising, burn, ecchymosis, erythema, signs of injury, laceration, lesion, petechiae, rash or wound.     Nails: There is no clubbing.  Neurological:     General: No focal deficit present.     Mental Status: She is alert and oriented to person, place, and time. Mental status is at baseline.     GCS: GCS eye subscore is 4. GCS verbal subscore is 5. GCS motor subscore is 6.     Cranial Nerves: Cranial nerves are intact. No cranial nerve deficit, dysarthria or facial asymmetry.     Sensory: Sensation is intact. No sensory deficit.     Motor: Motor function is intact. No weakness, tremor, atrophy, abnormal muscle tone or seizure activity.     Coordination: Coordination is intact. Coordination normal.     Gait: Gait is intact. Gait normal.  Deep Tendon Reflexes:     Reflex Scores:      Brachioradialis reflexes are 2+ on the right side and 2+ on the left side.      Patellar reflexes are 2+ on the right side and 2+ on the left side.    Comments: Gait sure and steady in clinic; in/out of chair without difficulty; bilateral hand grasp equal 5/5  Psychiatric:        Attention and Perception: Attention and perception normal.        Mood and Affect: Mood and affect, mood and affect normal.        Speech: Speech normal.        Behavior: Behavior normal. Behavior is cooperative.        Thought Content: Thought content normal.        Cognition and Memory: Cognition, memory and cognition and memory normal.        Judgment: Judgment normal.   lateral bending and rotation slightly decreased from baseline also        Assessment & Plan:  A-thoracic strain initial visit  P-Stop advil/all other nsaids.  Start diclofenac 75mg  DR po BID prn pain with food #30 RF0 dispensed from [employer] to patient.  Biofreeze, thermacare dispensed from clinic stock.  Demonstrated stretches.  Follow up prn consider chiropractor/massage/epsom salt  soak/tennis ball.   Home stretches demonstrated to patient-e.g. Arm circles/pendulums, finger spiders walking up wall, chest stretches, neck AROM, chin tucks, knee to chest and rock side to side on back and eagle arms. Self massage or professional prn, foam roller use or tennis/racquetball.  Heat/cryotherapy 15 minutes QID prn.  Trial thermacare 1 applied and another given to patient for use tomorrow from clinic stock.  Consider physical therapy referral if no improvement with prescribed therapy from [facility] and/or chiropractic care.  Ensure ergonomics correct desk at work avoid repetitive motions if possible/holding phone/laptop in hand use desk/stand and/or break up lifting items into smaller loads/weights.  Patient was instructed to rest, ice, and ROM exercises.  Activity as tolerated.   Follow up if symptoms persist or worsen especially if loss of bowel/bladder control, arm/leg weakness and/or saddle paresthesias.  [employer] handout on thoracic strain and rehab exercises printed and given to patient.    Patient verbalized agreement and understanding of treatment plan and had no further questions at this time.  P2:  Injury Prevention and Fitness.

## 2020-03-08 ENCOUNTER — Ambulatory Visit: Payer: Self-pay | Admitting: *Deleted

## 2020-03-08 ENCOUNTER — Other Ambulatory Visit: Payer: Self-pay

## 2020-03-08 VITALS — BP 126/89 | HR 61 | Ht 60.0 in | Wt 172.0 lb

## 2020-03-08 DIAGNOSIS — Z0000 Encounter for general adult medical examination without abnormal findings: Secondary | ICD-10-CM

## 2020-03-08 NOTE — Progress Notes (Signed)
[employer] insurance premium discount evaluation: Labs Drawn. Replacements ROI form signed. Tobacco Free Attestation form signed.  Forms placed in paper chart.  

## 2020-03-09 LAB — CMP12+LP+TP+TSH+6AC+CBC/D/PLT
ALT: 17 IU/L (ref 0–32)
AST: 16 IU/L (ref 0–40)
Albumin/Globulin Ratio: 1.6 (ref 1.2–2.2)
Albumin: 4.4 g/dL (ref 3.8–4.8)
Alkaline Phosphatase: 70 IU/L (ref 44–121)
BUN/Creatinine Ratio: 23 (ref 9–23)
BUN: 17 mg/dL (ref 6–24)
Basophils Absolute: 0.1 10*3/uL (ref 0.0–0.2)
Basos: 1 %
Bilirubin Total: 0.3 mg/dL (ref 0.0–1.2)
Calcium: 9.2 mg/dL (ref 8.7–10.2)
Chloride: 104 mmol/L (ref 96–106)
Chol/HDL Ratio: 3.3 ratio (ref 0.0–4.4)
Cholesterol, Total: 233 mg/dL — ABNORMAL HIGH (ref 100–199)
Creatinine, Ser: 0.73 mg/dL (ref 0.57–1.00)
EOS (ABSOLUTE): 0.1 10*3/uL (ref 0.0–0.4)
Eos: 2 %
Estimated CHD Risk: <0.5 times avg. (ref 0.0–1.0)
Free Thyroxine Index: 1.6 (ref 1.2–4.9)
GFR calc Af Amer: 113 mL/min/{1.73_m2} (ref >59)
GFR calc non Af Amer: 98 mL/min/{1.73_m2} (ref >59)
GGT: 9 IU/L (ref 0–60)
Globulin, Total: 2.7 g/dL (ref 1.5–4.5)
Glucose: 85 mg/dL (ref 65–99)
HDL: 70 mg/dL (ref >39)
Hematocrit: 37.6 % (ref 34.0–46.6)
Hemoglobin: 12.9 g/dL (ref 11.1–15.9)
Immature Grans (Abs): 0 10*3/uL (ref 0.0–0.1)
Immature Granulocytes: 0 %
Iron: 65 ug/dL (ref 27–159)
LDH: 193 IU/L (ref 119–226)
LDL Chol Calc (NIH): 154 mg/dL — ABNORMAL HIGH (ref 0–99)
Lymphocytes Absolute: 1.8 10*3/uL (ref 0.7–3.1)
Lymphs: 35 %
MCH: 29.9 pg (ref 26.6–33.0)
MCHC: 34.3 g/dL (ref 31.5–35.7)
MCV: 87 fL (ref 79–97)
Monocytes Absolute: 0.5 10*3/uL (ref 0.1–0.9)
Monocytes: 9 %
Neutrophils Absolute: 2.8 10*3/uL (ref 1.4–7.0)
Neutrophils: 53 %
Phosphorus: 3.3 mg/dL (ref 3.0–4.3)
Platelets: 302 10*3/uL (ref 150–450)
Potassium: 4.2 mmol/L (ref 3.5–5.2)
RBC: 4.32 x10E6/uL (ref 3.77–5.28)
RDW: 11.9 % (ref 11.7–15.4)
Sodium: 139 mmol/L (ref 134–144)
T3 Uptake Ratio: 24 % (ref 24–39)
T4, Total: 6.7 ug/dL (ref 4.5–12.0)
TSH: 1.73 u[IU]/mL (ref 0.450–4.500)
Total Protein: 7.1 g/dL (ref 6.0–8.5)
Triglycerides: 53 mg/dL (ref 0–149)
Uric Acid: 4.7 mg/dL (ref 2.6–6.2)
VLDL Cholesterol Cal: 9 mg/dL (ref 5–40)
WBC: 5.3 10*3/uL (ref 3.4–10.8)

## 2020-03-09 LAB — HGB A1C W/O EAG: Hgb A1c MFr Bld: 5 % (ref 4.8–5.6)

## 2020-09-19 ENCOUNTER — Encounter: Payer: Self-pay | Admitting: Nurse Practitioner

## 2020-09-19 ENCOUNTER — Other Ambulatory Visit (HOSPITAL_COMMUNITY)
Admission: RE | Admit: 2020-09-19 | Discharge: 2020-09-19 | Disposition: A | Discharge status: Home / Self Care | Payer: No Typology Code available for payment source | Source: Ambulatory Visit | Attending: Nurse Practitioner | Admitting: Nurse Practitioner

## 2020-09-19 ENCOUNTER — Ambulatory Visit (INDEPENDENT_AMBULATORY_CARE_PROVIDER_SITE_OTHER): Payer: No Typology Code available for payment source | Admitting: Nurse Practitioner

## 2020-09-19 ENCOUNTER — Other Ambulatory Visit: Payer: Self-pay

## 2020-09-19 VITALS — BP 122/78 | Ht 59.0 in | Wt 180.0 lb

## 2020-09-19 DIAGNOSIS — Z01419 Encounter for gynecological examination (general) (routine) without abnormal findings: Secondary | ICD-10-CM

## 2020-09-19 DIAGNOSIS — N951 Menopausal and female climacteric states: Secondary | ICD-10-CM | POA: Diagnosis not present

## 2020-09-19 NOTE — Progress Notes (Signed)
Frankie Zito 1970-05-14 [id number]   History:  50 y.o. G2P2002 presents to establish care. Has began to have irregular cycles and mild menopausal symptoms. Taking OTC supplements with some relief. History of abnormal pap smears, negative colposcopies x2 while pregnant with daughter 19 years ago. Normal paps since but they have been infrequent.  She had lost 45 pounds a few years back but with job changes/pandemic/calf injury she has gained about 40 pounds back.  She plans to do better with diet and she has restarted running.  Gynecologic History Patient's last menstrual period was 08/04/2020. Period Duration (Days): 4 Period Pattern: (!) Irregular Menstrual Flow: Heavy, Moderate Dysmenorrhea: (!) Moderate Dysmenorrhea Symptoms: Cramping Contraception/Family planning: vasectomy Sexually active: Yes  Health Maintenance Last Pap: 03/30/2013. Results were: Normal Last mammogram: 11/14/2019. Results were: Normal Last colonoscopy: Never Last Dexa: Not indicated  Past medical history, past surgical history, family history and social history were all reviewed and documented in the EPIC chart. Boyfriend of 22 years. 2 daughters ages 24 and 38. Mother with history of cervical cancer.   ROS:  A ROS was performed and pertinent positives and negatives are included.  Exam:  Vitals:   09/19/20 0811  BP: 122/78  Weight: 180 lb (81.6 kg)  Height: 4\' 11"  (1.499 m)   Body mass index is 36.36 kg/m.  General appearance:  Normal Thyroid:  Symmetrical, normal in size, without palpable masses or nodularity. Respiratory  Auscultation:  Clear without wheezing or rhonchi Cardiovascular  Auscultation:  Regular rate, without rubs, murmurs or gallops  Edema/varicosities:  Not grossly evident Abdominal  Soft,nontender, without masses, guarding or rebound.  Liver/spleen:  No organomegaly noted  Hernia:  None appreciated  Skin  Inspection:  Grossly normal Breasts: Examined lying and  sitting.   Right: Without masses, retractions, nipple discharge or axillary adenopathy.   Left: Without masses, retractions, nipple discharge or axillary adenopathy. Genitourinary   Inguinal/mons:  Normal without inguinal adenopathy  External genitalia:  Normal appearing vulva with no masses, tenderness, or lesions  BUS/Urethra/Skene's glands:  Normal  Vagina:  Normal appearing with normal color and discharge, no lesions  Cervix:  Normal appearing without discharge or lesions  Uterus:  Normal in size, shape and contour.  Midline and mobile, nontender  Adnexa/parametria:     Rt: Normal in size, without masses or tenderness.   Lt: Normal in size, without masses or tenderness.  Anus and perineum: Normal  Digital rectal exam: Normal sphincter tone without palpated masses or tenderness  Patient informed chaperone available to be present for breast and pelvic exam. Patient has requested no chaperone to be present. Patient has been advised what will be completed during breast and pelvic exam.   Assessment/Plan:  50 y.o. 55 for annual exam.   Well female exam with routine gynecological exam - Plan: Cytology - PAP( [facility]). Education provided on SBEs, importance of preventative screenings, current guidelines, high calcium diet, regular exercise, and multivitamin daily.  Labs through work.   Perimenopause - having irregular menses and mild menopausal symptoms. Taking OTC supplements with some relief.  We discussed what to expect during perimenopausal phase.  Screening for cervical cancer - History of abnormal pap smears, negative colposcopies x2 while pregnant with daughter 19 years ago. No interventions. Normal paps since but they have been infrequent.  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  Screening for breast cancer - Normal mammogram history.  Continue annual screenings.  Normal breast exam today.  Screening for colon cancer -average risk.  We  will start screenings at age  24.  Return in 1 year for annual.   Olivia Mackie DNP, 9:01 AM 09/19/2020

## 2020-09-20 LAB — CYTOLOGY - PAP
Comment: NEGATIVE
Diagnosis: NEGATIVE
High risk HPV: NEGATIVE

## 2020-11-08 ENCOUNTER — Ambulatory Visit: Payer: Self-pay | Admitting: Registered Nurse

## 2020-11-08 ENCOUNTER — Encounter: Payer: Self-pay | Admitting: Registered Nurse

## 2020-11-08 ENCOUNTER — Other Ambulatory Visit: Payer: Self-pay

## 2020-11-08 VITALS — BP 154/106 | HR 93

## 2020-11-08 DIAGNOSIS — R590 Localized enlarged lymph nodes: Secondary | ICD-10-CM

## 2020-11-08 DIAGNOSIS — J301 Allergic rhinitis due to pollen: Secondary | ICD-10-CM

## 2020-11-08 DIAGNOSIS — H6593 Unspecified nonsuppurative otitis media, bilateral: Secondary | ICD-10-CM

## 2020-11-08 DIAGNOSIS — R030 Elevated blood-pressure reading, without diagnosis of hypertension: Secondary | ICD-10-CM

## 2020-11-08 MED ORDER — SALINE SPRAY 0.65 % NA SOLN: 2.0000 | NASAL | 0 refills | Status: DC

## 2020-11-08 MED ORDER — ACETAMINOPHEN 500 MG PO TABS: 1000.0000 mg | ORAL_TABLET | Freq: Four times a day (QID) | ORAL | 0 refills | Status: AC | PRN

## 2020-11-08 MED ORDER — AMOXICILLIN-POT CLAVULANATE 875-125 MG PO TABS: 1.0000 | ORAL_TABLET | Freq: Two times a day (BID) | ORAL | 0 refills | Status: DC

## 2020-11-08 NOTE — Patient Instructions (Addendum)
General Headache Without Cause A headache is pain or discomfort felt around the head or neck area. The specific cause of a headache may not be found. There are many causes and types of headaches. A few common ones are: Tension headaches. Migraine headaches. Cluster headaches. Chronic daily headaches. Follow these instructions at home: Watch your condition for any changes. Let your health care provider know about them. Take these steps to help with your condition: Managing pain   Take over-the-counter and prescription medicines only as told by your health care provider. Lie down in a dark, quiet room when you have a headache. If directed, put ice on your head and neck area: Put ice in a plastic bag. Place a towel between your skin and the bag. Leave the ice on for 20 minutes, 2-3 times per day. If directed, apply heat to the affected area. Use the heat source that your health care provider recommends, such as a moist heat pack or a heating pad. Place a towel between your skin and the heat source. Leave the heat on for 20-30 minutes. Remove the heat if your skin turns bright red. This is especially important if you are unable to feel pain, heat, or cold. You may have a greater risk of getting burned. Keep lights dim if bright lights bother you or make your headaches worse. Eating and drinking Eat meals on a regular schedule. If you drink alcohol: Limit how much you use to: 0-1 drink a day for women. 0-2 drinks a day for men. Be aware of how much alcohol is in your drink. In the [place] one drink equals one 12 oz bottle of beer (355 mL), one 5 oz glass of wine (148 mL), or one 1 oz glass of hard liquor (44 mL). Stop drinking caffeine, or decrease the amount of caffeine you drink. General instructions  Keep a headache journal to help find out what may trigger your headaches. For example, write down: What you eat and drink. How much sleep you get. Any change to your diet or  medicines. Try massage or other relaxation techniques. Limit stress. Sit up straight, and do not tense your muscles. Do not use any products that contain nicotine or tobacco, such as cigarettes, e-cigarettes, and chewing tobacco. If you need help quitting, ask your health care provider. Exercise regularly as told by your health care provider. Sleep on a regular schedule. Get 7-9 hours of sleep each night, or the amount recommended by your health care provider. Keep all follow-up visits as told by your health care provider. This is important. Contact a health care provider if: Your symptoms are not helped by medicine. You have a headache that is different from the usual headache. You have nausea or you vomit. You have a fever. Get help right away if: Your headache becomes severe quickly. Your headache gets worse after moderate to intense physical activity. You have repeated vomiting. You have a stiff neck. You have a loss of vision. You have problems with speech. You have pain in the eye or ear. You have muscular weakness or loss of muscle control. You lose your balance or have trouble walking. You feel faint or pass out. You have confusion. You have a seizure. Summary A headache is pain or discomfort felt around the head or neck area. There are many causes and types of headaches. In some cases, the cause may not be found. Keep a headache journal to help find out what may trigger your headaches. Watch your condition  for any changes. Let your health care provider know about them. Contact a health care provider if you have a headache that is different from the usual headache, or if your symptoms are not helped by medicine. Get help right away if your headache becomes severe, you vomit, you have a loss of vision, you lose your balance, or you have a seizure. This information is not intended to replace advice given to you by your health care provider. Make sure you discuss any questions  you have with your health care provider. Document Revised: 07/27/2017 Document Reviewed: 07/27/2017 Elsevier Patient Education  2022 Else[employer] Preventing Hypertension Hypertension, also called high blood pressure, is when the force of blood pumping through the arteries is too strong. Arteries are blood vessels that carry blood from the heart throughout the body. Often, hypertension does not cause symptoms until blood pressure is very high. It is important to have your blood pressure checked regularly. Diet and lifestyle changes can help you prevent hypertension, and they may make you feel better overall and improve your quality of life. If you already have hypertension, you may control it with diet and lifestyle changes, as well as with medicine. How can this condition affect me? Over time, hypertension can damage the arteries and decrease blood flow to important parts of the body, including the brain, heart, and kidneys. By keeping your blood pressure in a healthy range, you can help prevent complications like heart attack, heart failure, stroke, kidney failure, and vascular dementia. What can increase my risk? Being an older adult. Older people are more often affected. Having family members who have had high blood pressure. Being obese. Being female. Males are more likely to have high blood pressure. Drinking too much alcohol or caffeine. Smoking or using illegal drugs. Taking certain medicines, such as antidepressants, decongestants, birth control pills, and NSAIDs, such as ibuprofen. Having thyroid problems. Having certain tumors. What actions can I take to prevent or manage this condition? Work with your health care provider to make a hypertension prevention plan that works for you. Follow your plan and keep all follow-up visits as told by your health care provider. Diet changes Maintain a healthy diet. This includes: Eating less salt (sodium). Ask your health care provider how much  sodium is safe for you to have. The general recommendation is to have less than 1 tsp (2,300 mg) of sodium a day. Do not add salt to your food. Choose low-sodium options when grocery shopping and eating out. Limiting fats in your diet. You can do this by eating low-fat or fat-free dairy products and by eating less red meat. Eating more fruits, vegetables, and whole grains. Make a goal to eat: 1-2 cups of fresh fruits and vegetables each day. 3-4 servings of whole grains each day. Avoiding foods and beverages that have added sugars. Eating fish that contain healthy fats (omega-3 fatty acids), such as mackerel or salmon. If you need help putting together a healthy eating plan, try the DASH diet. This diet is high in fruits, vegetables, and whole grains. It is low in sodium, red meat, and added sugars. DASH stands for Dietary Approaches to Stop Hypertension. Lifestyle changes Lose weight if you are overweight. Losing just 3?5% of your body weight can help prevent or control hypertension. For example, if your present weight is 200 lb (91 kg), a loss of 3-5% of your weight means losing 6-10 lb (2.7-4.5 kg). Ask your health care provider to help you with a diet and exercise  plan to safely lose weight. Other recommendations usually include: Get enough exercise. Do at least 150 minutes of moderate-intensity exercise each week. You could do this in short exercise sessions several times a day, or you could do longer exercise sessions a few times a week. For example, you could take a brisk 10-minute walk or bike ride, 3 times a day, for 5 days a week. Find ways to reduce stress, such as exercising, meditating, listening to music, or taking a yoga class. If you need help reducing stress, ask your health care provider. Do not use any products that contain nicotine or tobacco, such as cigarettes, e-cigarettes, and chewing tobacco. If you need help quitting, ask your health care provider. Chemicals in tobacco and  nicotine products raise your blood pressure each time you use them. If you need help quitting, ask your health care provider. Learn how to check your blood pressure at home. Make sure that you know your personal target blood pressure, as told by your health care provider. Try to sleep 7-9 hours per night.  Alcohol use Do not drink alcohol if: Your health care provider tells you not to drink. You are pregnant, may be pregnant, or are planning to become pregnant. If you drink alcohol: Limit how much you use to: 0-1 drink a day for women. 0-2 drinks a day for men. Be aware of how much alcohol is in your drink. In the [place] one drink equals one 12 oz bottle of beer (355 mL), one 5 oz glass of wine (148 mL), or one 1 oz glass of hard liquor (44 mL). Medicines In addition to diet and lifestyle changes, your health care provider may recommend medicines to help lower your blood pressure. In general: You may need to try a few different medicines to find what works best for you. You may need to take more than one medicine. Take over-the-counter and prescription medicines only as told by your health care provider. Questions to ask your health care provider What is my blood pressure goal? How can I lower my risk for high blood pressure? How should I monitor my blood pressure at home? Where to find support Your health care provider can help you prevent hypertension and help you keep your blood pressure at a healthy level. Your local hospital or your community may also provide support services and prevention programs. The American Heart Association offers an online support network at supportnetwork.heart.org Where to find more information Learn more about hypertension from: National Heart, Lung, and Blood Institute: PopSteam.is Centers for Disease Control and Prevention: FootballExhibition.com.br American Academy of Family Physicians: familydoctor.org Learn more about the DASH diet from: National Heart,  Lung, and Blood Institute: PopSteam.is Contact a health care provider if: You think you are having a reaction to medicines you have taken. You have recurrent headaches or feel dizzy. You have swelling in your ankles. You have trouble with your vision. Get help right away if: You have sudden, severe chest, back, or abdominal pain or discomfort. You have shortness of breath. You have a sudden, severe headache. These symptoms may represent a serious problem that is an emergency. Do not wait to see if the symptoms will go away. Get medical help right away. Call your local emergency services (911 in the [place] Do not drive yourself to the hospital.  Summary Hypertension often does not cause any symptoms until blood pressure is very high. It is important to get your blood pressure checked regularly. Diet and lifestyle changes are important steps  in preventing hypertension. By keeping your blood pressure in a healthy range, you may prevent complications like heart attack, heart failure, stroke, and kidney failure. Work with your health care provider to make a hypertension prevention plan that works for you. This information is not intended to replace advice given to you by your health care provider. Make sure you discuss any questions you have with your health care provider. Document Revised: 12/07/2018 Document Reviewed: 12/07/2018 Elsevier Patient Education  2022 Elsevier Inc. Allergic Rhinitis, Adult Allergic rhinitis is an allergic reaction that affects the mucous membrane inside the nose. The mucous membrane is the tissue that produces mucus. There are two types of allergic rhinitis: Seasonal. This type is also called hay fever and happens only during certain seasons. Perennial. This type can happen at any time of the year. Allergic rhinitis cannot be spread from person to person. This condition can be mild, moderate, or severe. It can develop at any age and may be outgrown. What are  the causes? This condition is caused by allergens. These are things that can cause an allergic reaction. Allergens may differ for seasonal allergic rhinitis and perennial allergic rhinitis. Seasonal allergic rhinitis is triggered by pollen. Pollen can come from grasses, trees, and weeds. Perennial allergic rhinitis may be triggered by: Dust mites. Proteins in a pet's urine, saliva, or dander. Dander is dead skin cells from a pet. Smoke, mold, or car fumes. What increases the risk? You are more likely to develop this condition if you have a family history of allergies or other conditions related to allergies, including: Allergic conjunctivitis. This is inflammation of parts of the eyes and eyelids. Asthma. This condition affects the lungs and makes it hard to breathe. Atopic dermatitis or eczema. This is long term (chronic) inflammation of the skin. Food allergies. What are the signs or symptoms? Symptoms of this condition include: Sneezing or coughing. A stuffy nose (nasal congestion), itchy nose, or nasal discharge. Itchy eyes and tearing of the eyes. A feeling of mucus dripping down the back of your throat (postnasal drip). Trouble sleeping. Tiredness or fatigue. Headache. Sore throat. How is this diagnosed? This condition may be diagnosed with your symptoms, medical history, and physical exam. Your health care provider may check for related conditions, such as: Asthma. Pink eye. This is eye inflammation caused by infection (conjunctivitis). Ear infection. Upper respiratory infection. This is an infection in the nose, throat, or upper airways. You may also have tests to find out which allergens trigger your symptoms. These may include skin tests or blood tests. How is this treated? There is no cure for this condition, but treatment can help control symptoms. Treatment may include: Taking medicines that block allergy symptoms, such as corticosteroids and antihistamines. Medicine may  be given as a shot, nasal spray, or pill. Avoiding any allergens. Being exposed again and again to tiny amounts of allergens to help you build a defense against allergens (immunotherapy). This is done if other treatments have not helped. It may include: Allergy shots. These are injected medicines that have small amounts of allergen in them. Sublingual immunotherapy. This involves taking small doses of a medicine with allergen in it under your tongue. If these treatments do not work, your health care provider may prescribe newer, stronger medicines. Follow these instructions at home: Avoiding allergens Find out what you are allergic to and avoid those allergens. These are some things you can do to help avoid allergens: If you have perennial allergies: Replace carpet with wood, tile,  or vinyl flooring. Carpet can trap dander and dust. Do not smoke. Do not allow smoking in your home. Change your heating and air conditioning filters at least once a month. If you have seasonal allergies, take these steps during allergy season: Keep windows closed as much as possible. Plan outdoor activities when pollen counts are lowest. Check pollen counts before you plan outdoor activities. When coming indoors, change clothing and shower before sitting on furniture or bedding. If you have a pet in the house that produces allergens: Keep the pet out of the bedroom. Vacuum, sweep, and dust regularly. General instructions Take over-the-counter and prescription medicines only as told by your health care provider. Drink enough fluid to keep your urine pale yellow. Keep all follow-up visits as told by your health care provider. This is important. Where to find more information American Academy of Allergy, Asthma & Immunology: www.aaaai.org Contact a health care provider if: You have a fever. You develop a cough that does not go away. You make whistling sounds when you breathe (wheeze). Your symptoms slow you  down or stop you from doing your normal activities each day. Get help right away if: You have shortness of breath. This symptom may represent a serious problem that is an emergency. Do not wait to see if the symptom will go away. Get medical help right away. Call your local emergency services (911 in the [place] Do not drive yourself to the hospital. Summary Allergic rhinitis may be managed by taking medicines as directed and avoiding allergens. If you have seasonal allergies, keep windows closed as much as possible during allergy season. Contact your health care provider if you develop a fever or a cough that does not go away. This information is not intended to replace advice given to you by your health care provider. Make sure you discuss any questions you have with your health care provider. Document Revised: 02/25/2019 Document Reviewed: 01/04/2019 Elsevier Patient Education  2022 Else[employer] Inc. Sinusitis, Adult Sinusitis is inflammation of your sinuses. Sinuses are hollow spaces in the bones around your face. Your sinuses are located: Around your eyes. In the middle of your forehead. Behind your nose. In your cheekbones. Mucus normally drains out of your sinuses. When your nasal tissues become inflamed or swollen, mucus can become trapped or blocked. This allows bacteria, viruses, and fungi to grow, which leads to infection. Most infections of the sinuses are caused by a virus. Sinusitis can develop quickly. It can last for up to 4 weeks (acute) or for more than 12 weeks (chronic). Sinusitis often develops after a cold. What are the causes? This condition is caused by anything that creates swelling in the sinuses or stops mucus from draining. This includes: Allergies. Asthma. Infection from bacteria or viruses. Deformities or blockages in your nose or sinuses. Abnormal growths in the nose (nasal polyps). Pollutants, such as chemicals or irritants in the air. Infection from fungi  (rare). What increases the risk? You are more likely to develop this condition if you: Have a weak body defense system (immune system). Do a lot of swimming or diving. Overuse nasal sprays. Smoke. What are the signs or symptoms? The main symptoms of this condition are pain and a feeling of pressure around the affected sinuses. Other symptoms include: Stuffy nose or congestion. Thick drainage from your nose. Swelling and warmth over the affected sinuses. Headache. Upper toothache. A cough that may get worse at night. Extra mucus that collects in the throat or the back of the  nose (postnasal drip). Decreased sense of smell and taste. Fatigue. A fever. Sore throat. Bad breath. How is this diagnosed? This condition is diagnosed based on: Your symptoms. Your medical history. A physical exam. Tests to find out if your condition is acute or chronic. This may include: Checking your nose for nasal polyps. Viewing your sinuses using a device that has a light (endoscope). Testing for allergies or bacteria. Imaging tests, such as an MRI or CT scan. In rare cases, a bone biopsy may be done to rule out more serious types of fungal sinus disease. How is this treated? Treatment for sinusitis depends on the cause and whether your condition is chronic or acute. If caused by a virus, your symptoms should go away on their own within 10 days. You may be given medicines to relieve symptoms. They include: Medicines that shrink swollen nasal passages (topical intranasal decongestants). Medicines that treat allergies (antihistamines). A spray that eases inflammation of the nostrils (topical intranasal corticosteroids). Rinses that help get rid of thick mucus in your nose (nasal saline washes). If caused by bacteria, your health care provider may recommend waiting to see if your symptoms improve. Most bacterial infections will get better without antibiotic medicine. You may be given antibiotics if you  have: A severe infection. A weak immune system. If caused by narrow nasal passages or nasal polyps, you may need to have surgery. Follow these instructions at home: Medicines Take, use, or apply over-the-counter and prescription medicines only as told by your health care provider. These may include nasal sprays. If you were prescribed an antibiotic medicine, take it as told by your health care provider. Do not stop taking the antibiotic even if you start to feel better. Hydrate and humidify  Drink enough fluid to keep your urine pale yellow. Staying hydrated will help to thin your mucus. Use a cool mist humidifier to keep the humidity level in your home above 50%. Inhale steam for 10-15 minutes, 3-4 times a day, or as told by your health care provider. You can do this in the bathroom while a hot shower is running. Limit your exposure to cool or dry air. Rest Rest as much as possible. Sleep with your head raised (elevated). Make sure you get enough sleep each night. General instructions  Apply a warm, moist washcloth to your face 3-4 times a day or as told by your health care provider. This will help with discomfort. Wash your hands often with soap and water to reduce your exposure to germs. If soap and water are not available, use hand sanitizer. Do not smoke. Avoid being around people who are smoking (secondhand smoke). Keep all follow-up visits as told by your health care provider. This is important. Contact a health care provider if: You have a fever. Your symptoms get worse. Your symptoms do not improve within 10 days. Get help right away if: You have a severe headache. You have persistent vomiting. You have severe pain or swelling around your face or eyes. You have vision problems. You develop confusion. Your neck is stiff. You have trouble breathing. Summary Sinusitis is soreness and inflammation of your sinuses. Sinuses are hollow spaces in the bones around your  face. This condition is caused by nasal tissues that become inflamed or swollen. The swelling traps or blocks the flow of mucus. This allows bacteria, viruses, and fungi to grow, which leads to infection. If you were prescribed an antibiotic medicine, take it as told by your health care provider. Do not  stop taking the antibiotic even if you start to feel better. Keep all follow-up visits as told by your health care provider. This is important. This information is not intended to replace advice given to you by your health care provider. Make sure you discuss any questions you have with your health care provider. Document Revised: 06/08/2017 Document Reviewed: 06/08/2017 Elsevier Patient Education  2022 Elsevier Inc. How to Perform a Sinus Rinse A sinus rinse is a home treatment that is used to rinse your sinuses with a sterile mixture of salt and water (saline solution). Sinuses are air-filled spaces in your skull behind the bones of your face and forehead that open into your nasal cavity. A sinus rinse can help to clear mucus, dirt, dust, or pollen from your nasal cavity. You may do a sinus rinse when you have a cold, a virus, nasal allergy symptoms, a sinus infection, or stuffiness in your nose or sinuses. Talk with your health care provider about whether a sinus rinse might help you. What are the risks? A sinus rinse is generally safe and effective. However, there are a few risks, which include: A burning sensation in your sinuses. This may happen if you do not make the saline solution as directed. Be sure to follow all directions when making the saline solution. Nasal irritation. Infection from contaminated water. This is rare, but possible. Do not do a sinus rinse if you have had ear or nasal surgery, ear infection, or blocked ears. Supplies needed: Saline solution or powder. Distilled or sterile water to mix with saline powder. You may use boiled and cooled tap water. Boil tap water for 5  minutes; cool until it is lukewarm. Use within 24 hours. Do not use regular tap water to mix with the saline solution. Neti pot or nasal rinse bottle. These supplies release the saline solution into your nose and through your sinuses. Neti pots and nasal rinse bottles can be purchased at [employer], a health food store, or online. How to perform a sinus rinse  Wash your hands with soap and water. Wash your device according to the directions that came with the product and then dry it. Use the solution that comes with your product or one that is sold separately in stores. Follow the mixing directions on the package to mix with sterile or distilled water. Fill the device with the amount of saline solution noted in the device instructions. Stand over a sink and tilt your head sideways over the sink. Place the spout of the device in your upper nostril (the one closer to the ceiling). Gently pour or squeeze the saline solution into your nasal cavity. The liquid should drain out from the lower nostril if you are not too congested. While rinsing, breathe through your open mouth. Gently blow your nose to clear any mucus and rinse solution. Blowing too hard may cause ear pain. Repeat in your other nostril. Clean and rinse your device with clean water and then air-dry it. Talk with your health care provider or pharmacist if you have questions about how to do a sinus rinse. Summary A sinus rinse is a home treatment that is used to rinse your sinuses with a sterile mixture of salt and water (saline solution). A sinus rinse is generally safe and effective. Follow all instructions carefully. Before doing a sinus rinse, talk with your health care provider about whether it would be helpful for you. This information is not intended to replace advice given to you by  your health care provider. Make sure you discuss any questions you have with your health care provider. Document Revised: 04/19/2020  Document Reviewed: 10/18/2019 Else[employer] Patient Education  2022 [employer] Otitis Media, Adult Otitis media occurs when there is inflammation and fluid in the middle ear with signs and symptoms of an acute infection. The middle ear is a part of the ear that contains bones for hearing as well as air that helps send sounds to the brain. When infected fluid builds up in this space, it causes pressure and can lead to an ear infection. The eustachian tube connects the middle ear to the back of the nose (nasopharynx) and normally allows air into the middle ear. If the eustachian tube becomes blocked, fluid can build up and become infected. What are the causes? This condition is caused by a blockage in the eustachian tube. This can be caused by mucus or by swelling of the tube. Problems that can cause a blockage include: A cold or other upper respiratory infection. Allergies. An irritant, such as tobacco smoke. Enlarged adenoids. The adenoids are areas of soft tissue located high in the back of the throat, behind the nose and the roof of the mouth. They are part of the body's defense system (immune system). A mass in the nasopharynx. Damage to the ear caused by pressure changes (barotrauma). What increases the risk? You are more likely to develop this condition if you: Smoke or are exposed to tobacco smoke. Have an opening in the roof of your mouth (cleft palate). Have gastroesophageal reflux. Have an immune system disorder. What are the signs or symptoms? Symptoms of this condition include: Ear pain. Fever. Decreased hearing. Tiredness (lethargy). Fluid leaking from the ear, if the eardrum is ruptured or has burst. Ringing in the ear. How is this diagnosed? This condition is diagnosed with a physical exam. During the exam, your health care provider will use an instrument called an otoscope to look in your ear and check for redness, swelling, and fluid. He or she will also ask about your  symptoms. Your health care provider may also order tests, such as: A pneumatic otoscopy. This is a test to check the movement of the eardrum. It is done by squeezing a small amount of air into the ear. A tympanogram. This is a test that shows how well the eardrum moves in response to air pressure in the ear canal. It provides a graph for your health care provider to review. How is this treated? This condition can go away on its own within 3-5 days. But if the condition is caused by a bacterial infection and does not go away on its own, or if it keeps coming back, your health care provider may: Prescribe antibiotic medicine to treat the infection. Prescribe or recommend medicines to control pain. Follow these instructions at home: Take over-the-counter and prescription medicines only as told by your health care provider. If you were prescribed an antibiotic medicine, take it as told by your health care provider. Do not stop taking the antibiotic even if you start to feel better. Keep all follow-up visits. This is important. Contact a health care provider if: You have bleeding from your nose. There is a lump on your neck. You are not feeling better in 5 days. You feel worse instead of better. Get help right away if: You have severe pain that is not controlled with medicine. You have swelling, redness, or pain around your ear. You have stiffness in your neck. A  part of your face is not moving (paralyzed). The bone behind your ear (mastoid bone) is tender when you touch it. You develop a severe headache. Summary Otitis media is redness, soreness, and swelling of the middle ear, usually resulting in pain and decreased hearing. This condition can go away on its own within 3-5 days. If the problem does not go away in 3-5 days, your health care provider may give you medicines to treat the infection. If you were prescribed an antibiotic medicine, take it as told by your health care  provider. Follow all instructions that were given to you by your health care provider. This information is not intended to replace advice given to you by your health care provider. Make sure you discuss any questions you have with your health care provider. Document Revised: 04/16/2020 Document Reviewed: 04/16/2020 Else[employer] Patient Education  2022 [employer] Lymphadenopathy Lymphadenopathy means that your lymph glands are swollen or larger than normal. Lymph glands, also called lymph nodes, are collections of tissue that filter excess fluid, bacteria, viruses, and waste from your bloodstream. They are part of your body's disease-fighting system (immune system), which protects your body from germs. There may be different causes of lymphadenopathy, depending on where it is in your body. Some types go away on their own. Lymphadenopathy can occur anywhere that you have lymph glands, including these areas: Neck (cervical lymphadenopathy). Chest (mediastinal lymphadenopathy). Lungs (hilar lymphadenopathy). Underarms (axillary lymphadenopathy). Groin (inguinal lymphadenopathy). When your immune system responds to germs, infection-fighting cells and fluid build up in your lymph glands. This causes some swelling and enlargement. If the lymph nodes do not go back to normal size after you have an infection or disease, your health care provider may do tests. These tests help to monitor your condition and find the reason why the glands are still swollen and enlarged. Follow these instructions at home:  Get plenty of rest. Your health care provider may recommend over-the-counter medicines for pain. Take over-the-counter and prescription medicines only as told by your health care provider. If directed, apply heat to swollen lymph glands as often as told by your health care provider. Use the heat source that your health care provider recommends, such as a moist heat pack or a heating pad. Place a towel  between your skin and the heat source. Leave the heat on for 20-30 minutes. Remove the heat if your skin turns bright red. This is especially important if you are unable to feel pain, heat, or cold. You may have a greater risk of getting burned. Check your affected lymph glands every day for changes. Check other lymph gland areas as told by your health care provider. Check for changes such as: More swelling. Sudden increase in size. Redness or pain. Hardness. Keep all follow-up visits. This is important. Contact a health care provider if you have: Lymph glands that: Are still swollen after 2 weeks. Have suddenly gotten bigger or the swelling spreads. Are red, painful, or hard. Fluid leaking from the skin near an enlarged lymph gland. Problems with breathing. A fever, chills, or night sweats. Fatigue. A sore throat. Pain in your abdomen. Weight loss. Get help right away if you have: Severe pain. Chest pain. Shortness of breath. These symptoms may represent a serious problem that is an emergency. Do not wait to see if the symptoms will go away. Get medical help right away. Call your local emergency services (911 in the [place] Do not drive yourself to the hospital. Summary Lymphadenopathy means that your  lymph glands are swollen or larger than normal. Lymph glands, also called lymph nodes, are collections of tissue that filter excess fluid, bacteria, viruses, and waste from the bloodstream. They are part of your body's disease-fighting system (immune system). Lymphadenopathy can occur anywhere that you have lymph glands. If the lymph nodes do not go back to normal size after you have an infection or disease, your health care provider may do tests to monitor your condition and find the reason why the glands are still swollen and enlarged. Check your affected lymph glands every day for changes. Check other lymph gland areas as told by your health care provider. This information is not  intended to replace advice given to you by your health care provider. Make sure you discuss any questions you have with your health care provider. Document Revised: 11/02/2019 Document Reviewed: 11/02/2019 Else[employer] Patient Education  2022 [employer] Eustachian Tube Dysfunction Eustachian tube dysfunction refers to a condition in which a blockage develops in the narrow passage that connects the middle ear to the back of the nose (eustachian tube). The eustachian tube regulates air pressure in the middle ear by letting air move between the ear and nose. It also helps to drain fluid from the middle ear space. Eustachian tube dysfunction can affect one or both ears. When the eustachian tube does not function properly, air pressure, fluid, or both can build up in the middle ear. What are the causes? This condition occurs when the eustachian tube becomes blocked or cannot open normally. Common causes of this condition include: Ear infections. Colds and other infections that affect the nose, mouth, and throat (upper respiratory tract). Allergies. Irritation from cigarette smoke. Irritation from stomach acid coming up into the esophagus (gastroesophageal reflux). The esophagus is the part of the body that moves food from the mouth to the stomach. Sudden changes in air pressure, such as from descending in an airplane or scuba diving. Abnormal growths in the nose or throat, such as: Growths that line the nose (nasal polyps). Abnormal growth of cells (tumors). Enlarged tissue at the back of the throat (adenoids). What increases the risk? You are more likely to develop this condition if: You smoke. You are overweight. You are a child who has: Certain birth defects of the mouth, such as cleft palate. Large tonsils or adenoids. What are the signs or symptoms? Common symptoms of this condition include: A feeling of fullness in the ear. Ear pain. Clicking or popping noises in the ear. Ringing in  the ear (tinnitus). Hearing loss. Loss of balance. Dizziness. Symptoms may get worse when the air pressure around you changes, such as when you travel to an area of high elevation, fly on an airplane, or go scuba diving. How is this diagnosed? This condition may be diagnosed based on: Your symptoms. A physical exam of your ears, nose, and throat. Tests, such as those that measure: The movement of your eardrum. Your hearing (audiometry). How is this treated? Treatment depends on the cause and severity of your condition. In mild cases, you may relieve your symptoms by moving air into your ears. This is called "popping the ears." In more severe cases, or if you have symptoms of fluid in your ears, treatment may include: Medicines to relieve congestion (decongestants). Medicines that treat allergies (antihistamines). Nasal sprays or ear drops that contain medicines that reduce swelling (steroids). A procedure to drain the fluid in your eardrum. In this procedure, a small tube may be placed in the eardrum to:  Drain the fluid. Restore the air in the middle ear space. A procedure to insert a balloon device through the nose to inflate the opening of the eustachian tube (balloon dilation). Follow these instructions at home: Lifestyle Do not do any of the following until your health care provider approves: Travel to high altitudes. Fly in airplanes. Work in a Estate agent or room. Scuba dive. Do not use any products that contain nicotine or tobacco. These products include cigarettes, chewing tobacco, and vaping devices, such as e-cigarettes. If you need help quitting, ask your health care provider. Keep your ears dry. Wear fitted earplugs during showering and bathing. Dry your ears completely after. General instructions Take over-the-counter and prescription medicines only as told by your health care provider. Use techniques to help pop your ears as recommended by your health care  provider. These may include: Chewing gum. Yawning. Frequent, forceful swallowing. Closing your mouth, holding your nose closed, and gently blowing as if you are trying to blow air out of your nose. Keep all follow-up visits. This is important. Contact a health care provider if: Your symptoms do not go away after treatment. Your symptoms come back after treatment. You are unable to pop your ears. You have: A fever. Pain in your ear. Pain in your head or neck. Fluid draining from your ear. Your hearing suddenly changes. You become very dizzy. You lose your balance. Get help right away if: You have a sudden, severe increase in any of your symptoms. Summary Eustachian tube dysfunction refers to a condition in which a blockage develops in the eustachian tube. It can be caused by ear infections, allergies, inhaled irritants, or abnormal growths in the nose or throat. Symptoms may include ear pain or fullness, hearing loss, or ringing in the ears. Mild cases are treated with techniques to unblock the ears, such as yawning or chewing gum. More severe cases are treated with medicines or procedures. This information is not intended to replace advice given to you by your health care provider. Make sure you discuss any questions you have with your health care provider. Document Revised: 03/19/2020 Document Reviewed: 03/19/2020 Else[employer] Patient Education  2022 [employer].

## 2020-11-08 NOTE — Progress Notes (Signed)
Subjective:    Patient ID: Madison Garner, female    DOB: 1970/02/26, 50 y.o.   MRN: [id number]  50y/o Caucasian established female pt c/o R ear pain and headache x5 days. Extends down to R side of face when pain occurs and face feels hot. Endorses one swollen lymphnode R submandibular. Has been taking naproxen OTC daily po prn pain/headache.  Hasn't noticed any sinus issues.  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  Didn't sleep well last night woke up every hour either due to hot flash, partner snoring, daughter turning on lights in kitchen or she needs to get up at night to urinate.  "I am a light sleeper"  I keep fan running for hot flashes and to block out noises from other family members.  BP elevated today. Pt denies any HTN symptoms aside from headaches. Repeated after sitting >5 minutes. Systolic BP about 10 pts lower.  Patient had normal caffeine this am still drinking her one serving.  Started running again recently.  Feet/knees doing okay and able to go up stairs after running.  Denied chest pain/dyspnea/shortness of breath/visual changes/syncope.     Review of Systems  Constitutional:  Negative for activity change, appetite change, chills, fatigue and fever.  HENT:  Positive for ear pain. Negative for congestion, ear discharge, facial swelling, hearing loss, mouth sores, nosebleeds, postnasal drip, rhinorrhea, sinus pressure, sinus pain, sneezing, sore throat, tinnitus, trouble swallowing and voice change.   Eyes:  Negative for photophobia and visual disturbance.  Respiratory:  Negative for cough, choking, shortness of breath, wheezing and stridor.   Cardiovascular:  Negative for chest pain.  Gastrointestinal:  Negative for diarrhea and vomiting.  Endocrine: Negative for cold intolerance and heat intolerance.  Genitourinary:  Positive for frequency. Negative for difficulty urinating.  Musculoskeletal:  Negative for arthralgias, back pain, gait problem, joint swelling, myalgias, neck pain and  neck stiffness.  Skin:  Negative for color change and rash.  Allergic/Immunologic: Positive for environmental allergies. Negative for food allergies.  Neurological:  Positive for headaches. Negative for dizziness, tremors, seizures, syncope, facial asymmetry, speech difficulty, weakness, light-headedness and numbness.  Hematological:  Positive for adenopathy. Does not bruise/bleed easily.  Psychiatric/Behavioral:  Positive for sleep disturbance. Negative for agitation and confusion.       Objective:   Physical Exam Vitals and nursing note reviewed.  Constitutional:      General: She is awake. She is not in acute distress.    Appearance: Normal appearance. She is well-developed, well-groomed and overweight. She is not ill-appearing, toxic-appearing or diaphoretic.  HENT:     Head: Normocephalic and atraumatic.     Jaw: There is normal jaw occlusion. No trismus, tenderness, swelling, pain on movement or malocclusion.     Salivary Glands: Right salivary gland is not diffusely enlarged or tender. Left salivary gland is not diffusely enlarged or tender.     Right Ear: Hearing, ear canal and external ear normal. No decreased hearing noted. Tenderness present. No laceration, drainage or swelling. A middle ear effusion is present. There is no impacted cerumen. No foreign body. No mastoid tenderness. No PE tube. No hemotympanum. Tympanic membrane is erythematous and bulging. Tympanic membrane is not injected, scarred, perforated or retracted.     Left Ear: Hearing, ear canal and external ear normal. No decreased hearing noted. No laceration, drainage, swelling or tenderness. A middle ear effusion is present. There is no impacted cerumen. No foreign body. No mastoid tenderness. No PE tube. No hemotympanum. Tympanic membrane is erythematous and bulging. Tympanic  membrane is not injected, scarred, perforated or retracted.     Ears:     Comments: Mildly erythematous bilateral canals and TMs left greater than  R; air fluid level clear bilateral TMs; bilateral allergic shiners and lower eyelids nonpitting edema 1-2+/4; cobblestoning posterior pharynx    Nose: Mucosal edema present. No nasal deformity, septal deviation, laceration, congestion or rhinorrhea.     Right Sinus: No maxillary sinus tenderness or frontal sinus tenderness.     Left Sinus: No maxillary sinus tenderness or frontal sinus tenderness.     Mouth/Throat:     Lips: Pink. No lesions.     Mouth: Mucous membranes are moist. Mucous membranes are not pale, not dry and not cyanotic. No lacerations, oral lesions or angioedema.     Dentition: No dental abscesses or gum lesions.     Tongue: No lesions. Tongue does not deviate from midline.     Palate: No mass and lesions.     Pharynx: Uvula midline. Pharyngeal swelling and posterior oropharyngeal erythema present. No oropharyngeal exudate or uvula swelling.     Tonsils: No tonsillar exudate or tonsillar abscesses. 0 on the right. 0 on the left.  Eyes:     General: Lids are normal. Vision grossly intact. Gaze aligned appropriately. Allergic shiner present. No scleral icterus.       Right eye: No foreign body, discharge or hordeolum.        Left eye: No foreign body, discharge or hordeolum.     Extraocular Movements: Extraocular movements intact.     Right eye: Normal extraocular motion and no nystagmus.     Left eye: Normal extraocular motion and no nystagmus.     Conjunctiva/sclera: Conjunctivae normal.     Right eye: Right conjunctiva is not injected. No chemosis, exudate or hemorrhage.    Left eye: Left conjunctiva is not injected. No chemosis, exudate or hemorrhage.    Pupils: Pupils are equal, round, and reactive to light. Pupils are equal.     Right eye: Pupil is round and reactive.     Left eye: Pupil is round and reactive.  Neck:     Thyroid: No thyroid mass or thyromegaly.     Trachea: Trachea and phonation normal. No tracheal tenderness or tracheal deviation.  Cardiovascular:      Rate and Rhythm: Normal rate and regular rhythm.     Chest Wall: PMI is not displaced.     Pulses: Normal pulses.          Radial pulses are 2+ on the right side and 2+ on the left side.     Heart sounds: Normal heart sounds, S1 normal and S2 normal. Heart sounds not distant. No murmur heard. Pulmonary:     Effort: Pulmonary effort is normal. No accessory muscle usage or respiratory distress.     Breath sounds: Normal breath sounds and air entry. No stridor or transmitted upper airway sounds. No decreased breath sounds, wheezing, rhonchi or rales.  Chest:     Chest wall: No tenderness.  Abdominal:     General: There is no distension.     Palpations: Abdomen is soft.  Musculoskeletal:        General: No tenderness. Normal range of motion.     Right shoulder: Normal.     Left shoulder: Normal.     Right elbow: Normal.     Left elbow: Normal.     Right hand: Normal.     Left hand: Normal.     Cervical back:  Normal, normal range of motion and neck supple. No swelling, edema, deformity, erythema, signs of trauma, lacerations, rigidity, tenderness or crepitus. Normal range of motion.     Thoracic back: Normal. No swelling, edema, deformity, signs of trauma or lacerations. Normal range of motion.     Right hip: Normal.     Left hip: Normal.     Right knee: Normal.     Left knee: Normal.  Lymphadenopathy:     Head:     Right side of head: Submental adenopathy present. No submandibular, tonsillar, preauricular, posterior auricular or occipital adenopathy.     Left side of head: No submental, submandibular, tonsillar, preauricular, posterior auricular or occipital adenopathy.     Cervical: No cervical adenopathy.     Right cervical: No superficial, deep or posterior cervical adenopathy.    Left cervical: No superficial, deep or posterior cervical adenopathy.  Skin:    General: Skin is warm and dry.     Capillary Refill: Capillary refill takes less than 2 seconds.     Coloration: Skin is  not ashen, cyanotic, jaundiced, mottled, pale or sallow.     Findings: No abrasion, abscess, acne, bruising, burn, ecchymosis, erythema, signs of injury, laceration, lesion, petechiae, rash or wound.     Nails: There is no clubbing.  Neurological:     Mental Status: She is alert and oriented to person, place, and time. She is not disoriented.     GCS: GCS eye subscore is 4. GCS verbal subscore is 5. GCS motor subscore is 6.     Cranial Nerves: Cranial nerves 2-12 are intact. No cranial nerve deficit, dysarthria or facial asymmetry.     Sensory: Sensation is intact. No sensory deficit.     Motor: Motor function is intact. No weakness, tremor, atrophy, abnormal muscle tone or seizure activity.     Coordination: Coordination is intact. Coordination normal.     Gait: Gait is intact. Gait normal.     Comments: Gait sure and steady in clinic; in/out of chair and on/off exam table without difficulty; bilateral hand grasp equal 5/5  Psychiatric:        Attention and Perception: Attention and perception normal.        Mood and Affect: Mood and affect normal.        Speech: Speech normal.        Behavior: Behavior normal. Behavior is cooperative.        Thought Content: Thought content normal.        Cognition and Memory: Cognition and memory normal.        Judgment: Judgment normal.          Assessment & Plan:   A-bilateral otitis media with effusion, seasonal allergic rhinitis, elevated blood pressure; enlarged submental lymph node right initial visit  P-Supportive treatment. Augmentin 875mg  po BID x 10 days #20 RF0 dispensed from [facility] to patient  start if worsening ear pain/discharge/fever.  Tylenol 1000mg  po QID prn pain/fever given 4 UD from clinic stock.   No evidence of invasive bacterial infection, non toxic and well hydrated.  This is most likely self limiting viral infection.  I do not see where any further testing or imaging is necessary at this time.   I will suggest supportive care,  rest, good hygiene and encourage the patient to take adequate fluids.  The patient is to return to clinic or EMERGENCY ROOM if symptoms worsen or change significantly e.g. ear pain, fever, purulent discharge from ears or bleeding.  Exitcare handout on otitis media to my chart patient refused printed copy  Patient verbalized agreement and understanding of treatment plan and had no further questions at this time.     Patient may use normal saline nasal spray 2 sprays each nostril q2h wa as needed. flonase 1 spray each nostril BID.  Patient denied personal or family history of ENT cancer.  OTC antihistamine of choice claritin 10mg  po daily.  Consider restarting singulair 10mg  po qhs.  Avoid triggers if possible.  Shower prior to bedtime if exposed to triggers.  If allergic dust/dust mites recommend mattress/pillow covers/encasements; washing linens, vacuuming, sweeping, dusting weekly.  Call or return to clinic as needed if these symptoms worsen or fail to improve as anticipated.   Exitcare handouta on allergic rhinitis and sinus rinse to my chart patient refused printout.  Patient verbalized understanding of instructions, agreed with plan of care and had no further questions at this time.  P2:  Avoidance and hand washing.   Decrease caffeine intake today.   ER if chest pain, worst headache of life, dyspnea or visual changes for re-evaluation.  Will see RN next week for repeat BP check.  Exitcare handout preventing hypertension.  Discussed NSAIDS can sometimes increase BP, in addition to poor sleep, pain, and/or caffeine.  Patient verbalized understanding information/instructions, agreed with plan of care and had no further questions at this time.   Will monitor lymph node and if no resolution in the next 6 weeks consider labs/referral.  Exitcare handout on lymphadenopathy to my chart.

## 2021-01-01 ENCOUNTER — Telehealth: Payer: Self-pay | Admitting: Registered Nurse

## 2021-01-01 ENCOUNTER — Encounter: Payer: Self-pay | Admitting: Registered Nurse

## 2021-01-01 DIAGNOSIS — J069 Acute upper respiratory infection, unspecified: Secondary | ICD-10-CM

## 2021-01-01 MED ORDER — PSEUDOEPHEDRINE HCL ER 120 MG PO TB12: 120.0000 mg | ORAL_TABLET | Freq: Two times a day (BID) | ORAL | 2 refills | Status: AC

## 2021-01-01 MED ORDER — FLUTICASONE PROPIONATE 50 MCG/ACT NA SUSP: 1.0000 | Freq: Two times a day (BID) | NASAL | 6 refills | Status: DC

## 2021-01-01 MED ORDER — SALINE SPRAY 0.65 % NA SOLN: 2.0000 | NASAL | 0 refills | Status: DC

## 2021-01-01 MED ORDER — MONTELUKAST SODIUM 10 MG PO TABS: 10.0000 mg | ORAL_TABLET | Freq: Every day | ORAL | 3 refills | Status: DC

## 2021-01-01 MED ORDER — PROMETHAZINE HCL 6.25 MG/5ML PO SYRP: 6.2500 mg | ORAL_SOLUTION | Freq: Four times a day (QID) | ORAL | 0 refills | Status: DC | PRN

## 2021-01-01 NOTE — Telephone Encounter (Signed)
Notified by [employer] HR patient called out sick this am.  Patient contacted via telephone and symptoms started Saturday 10 Dec around 2000-2100 chills, midnight started throwing up had chinese that night thought it was food related vomiting and chills continued.  Woke up Sunday 12/30/20 morning and felt better but now had headache and pressure behind eyes.  Started tylenol but afraid to take sudafed due to GI upset.  Later in the day GI upset decreased and started sudafed.  Was scheduled off yesterday 12/31/20.  Slept all day and took tylenol and sudafed.  Saturday night 10 Dec covid test negative.  Monday 12/12 morning covid home test negative again.  Chest congestion and hoarse voice this morning.  Feels like sinus infection to her.  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  Very stuffed up though so saline not getting very far up into sinuses and just running right back out nose.  Discussed afrin use 1 spray each nostril twice a day for 3-4 days maximum.  She did use last night.  Increase nasal saline frequency discussed even low volume mist every couple   Patient stated sudafed is helping and afrin helped her to sleep last night.  Needs new Rx for sudafed to use [employer]SA card. Electronic Rx Pseudoephedrine 120mg  po BID prn congestion #20 RF2 sent to her pharmacy of choice  Cough worsening and would like promethazine liquid Rx as ran out of her last bottle typically only takes prior to sleeping.  Electronic Rx promethazine 6.25mg /78ml po q6h prn cough #120 RF0.  She also has run out of refills on flonase nasal 4m 1 spray each nostril BID #16gm RF6 sent to her pharmacy of choice.  Noted singulair 10mg  po qhs prn expired also and refill sent to her pharmacy of choice #30 RF3.  Discussed with patient may return onsite tomorrow as long as symptoms improving and no vomiting/diarrhea/fever in previous 24 hours.  Discussed with patient parainfluenza, metapneumovirus,  influenza, RSV, covid circulating in community.  Mask wear and no eating in employee lunch room while symptomatic.  Discussed that these other viruses can be infectious up to 10 days.  HR notified cleared to return onsite tomorrow as long as symptoms improving.  Patient to call out sick if no improvement/worsening and contact me [phone] or [email or web address].  Patient A&Ox3 moderate productive cough/nasal congestion/hoarse voice noted during 7 minute telephone call.  Patient spoke full sentences without difficulty.  Patient verbalized understanding information/instructions, agreed with plan of care and had no further questions at this time.

## 2021-01-01 NOTE — Telephone Encounter (Signed)
Patient left message pharmacy would not fill her sudafed today and she needs to come back tomorrow.  Patient asking if Rx needs to be modified/resent.  Contacted pharmacy staff and [facility] brand pseudoephedrine available for fill.  Pharmacy closes in 5 minutes.  Patient contacted and stated after speaking with pharmacy staff her Rx was filled this afternoon but she forgot to call me back.  Feeling more tired after trip to pharmacy and called out sick for tomorrow to stay home and rest.  Casimiro Needle just came home from work and not feeling well now also.  Discussed with patient to contact me tomorrow if symptoms worsening.  Patient with hoarse voice/congestion, no cough audible during 3 minute telephone call.  Spoke full sentences without difficulty A&Ox3.  Patient verbalized understanding information/instructions, agreed with plan of care and had no further questions at this time.

## 2021-01-02 NOTE — Telephone Encounter (Signed)
Patient contacted via telephone and stated feeling the same.  Took afrin and promethazine before bed.  Woke up 0300 with coughing.  Decreased headache today.  Didn't take sudafed during day today.  Not feeling as stuffy and using more saline throughout the day.  Napping and drinking fluids.  Patient weighed self today and has lost 5 lbs.  Patient plans to come into work tomorrow and will call out sick if unable to work in am.  If coughing continually waking her up will stay home possibly work remote.  Did not work remote today. Patient congestion decreased.  Glasses resting on bridge of nose was very uncomfortable yesterday and today less so and can consider working remote as needs glasses to read computer screen.  Patient voice mildly hoarse today.  Patient A&Ox3 spoke full sentences without difficulty.  Congestion noted during 8 minute call.  No cough or throat clearing heard.  HR notified patient symptoms improving cleared to return onsite tomorrow with mask wear until symptoms resolved and no eating in employee lunch room.  Patient will call out sick in am if not feeling well enough to work due to cough waking her up.  Discussed timing of promethazine dosing, singulair, flonase, nasal saline.  Patient verbalized understanding information/instructions, agreed with plan of care and had no further questions at this time.

## 2021-01-03 NOTE — Telephone Encounter (Signed)
Called pt's work line to confirm if she returned to work today. No answer. LVM.

## 2021-01-03 NOTE — Telephone Encounter (Signed)
Pt returned call via email reporting that she is onsite today 12/15. She reports being "still quite stuffy/congested when unmedicated and a slight cough/chest tightness with activity/talking. I am masked and will go out for lunch."

## 2021-01-04 NOTE — Telephone Encounter (Signed)
Patient stopped in clinic onsite at work today still having some congestion and ears popping.  Otoscopic exam found bilateral TMs air fluid level clear, cobblestoning posterior pharynx and bilateral allergic shiners.  Nasal congestion improved from yesterday.  Patient restarted sudafed today and taking 120mg  po BID.  Discussed eustachian tube dysfunction from post nasal drip/throat irritation/swelling and will take 30 days for fluid to clear from ears continue trying to control rhinitis as will decrease throat irritation.  Patient drinking tea with honey.  Discussed soup broth can help soothe throat also and decrease swelling.  Continue nasal saline/flonase/singulair.  Patient A&Ox3 spoke full sentences without difficulty respirations even and unlabored no wheezing/shortness of breath.  Skin warm dry and pink gait sure and steady hallway.  BBS CTA.  Follow up prn new or worsening symptoms especially ear discharge/worsening pain ears/sinuses.  Patient going to eat lunch in her car today.  Continue mask wear while symptomatic and no eating in employee lunch room until symptoms resolved to help prevent spread of viral infection to others.  Patient verbalized understanding information/instructions, agreed with plan of care and had no further questions at this time.

## 2021-01-06 NOTE — Telephone Encounter (Signed)
Patient contacted via telephone and stated still has a little bit cough and sinus pressure.  Trialed last night not taking hs meds and felt horrible.  Restarted medication today congestion about the same as last week.  Patient reported mucous clear.  Has promethazine, phenergan, sudafed, flonase and nasal saline.  Continue use.  Just got refills Wednesday 14 Dec does not need any new Rx.  Discussed if worsening ear/sinus pain or mucous completely cloudy to come to clinic again this week.  Patient A&Ox3 spoke full sentences without difficulty.  Nasal congestion audible during 3 minute telephone call.  No cough/throat clearing noted.  Patient verbalized understanding information/instructions, agreed with plan of care and had no further questions at this time.

## 2021-01-08 NOTE — Telephone Encounter (Signed)
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  Was evaluated by RN Rolly Salter yesterday and effusion seen but not infection/redness/discharge.  Patient taking OTC cough/cold and sinus pressure/congestion nasal improving along with ear pain today.  Patient wearing mask in warehouse spoke full sentences without difficulty no cough observed bilateral allergic shiners and lower eyelids nonpitting edema 1+/4  patient A&Ox3 skin warm dry and pink respirations even and unlabored RA no cough during 3 minute conversation  gait sure and steady.  Patient to follow up re-evaluation if worsening of symptoms or new.  Patient verbalized understanding information/instructions, agreed with plan of care and had no further questions at this time.

## 2021-01-18 NOTE — Telephone Encounter (Signed)
Patient returned call.  "I'm much better now, still have an occasional cough after eating or when I'm being active but when I'm still for a while no cough at all.  I started back walking over Christmas weekend trying to get back to normal."  Patient denied questions or concerns at this time.  Will follow up with her again next week to ensure cough resolution and no worsening.  She can also follow up with clinic staff prn worsening or no resolution of cough.

## 2021-01-22 ENCOUNTER — Encounter: Payer: Self-pay | Admitting: Registered Nurse

## 2021-01-22 ENCOUNTER — Other Ambulatory Visit: Payer: Self-pay | Admitting: Registered Nurse

## 2021-01-22 DIAGNOSIS — J301 Allergic rhinitis due to pollen: Secondary | ICD-10-CM

## 2021-01-22 NOTE — Telephone Encounter (Signed)
Electronic Rx sent to her pharmacy of choice for patient loratadine 10mg  po daily #90 RF3 refill for chronic allergies

## 2021-01-27 NOTE — Telephone Encounter (Signed)
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  A&Ox3 spoke full sentences without difficulty.  No audible cough/congestion/throat clearing.  Gait sure and steady skin warm dry and pink respirations even and unlabored.

## 2021-02-06 ENCOUNTER — Other Ambulatory Visit: Payer: Self-pay | Admitting: Nurse Practitioner

## 2021-02-06 DIAGNOSIS — Z1231 Encounter for screening mammogram for malignant neoplasm of breast: Secondary | ICD-10-CM

## 2021-02-12 ENCOUNTER — Other Ambulatory Visit: Payer: Self-pay

## 2021-02-12 ENCOUNTER — Ambulatory Visit
Admission: RE | Admit: 2021-02-12 | Discharge: 2021-02-12 | Disposition: A | Discharge status: Home / Self Care | Payer: No Typology Code available for payment source | Source: Ambulatory Visit | Attending: Nurse Practitioner | Admitting: Nurse Practitioner

## 2021-02-12 DIAGNOSIS — Z1231 Encounter for screening mammogram for malignant neoplasm of breast: Secondary | ICD-10-CM

## 2021-05-02 ENCOUNTER — Ambulatory Visit: Payer: Self-pay | Admitting: *Deleted

## 2021-05-02 ENCOUNTER — Other Ambulatory Visit: Payer: Self-pay | Admitting: Registered Nurse

## 2021-05-02 VITALS — BP 140/95 | Ht 60.0 in | Wt 176.0 lb

## 2021-05-02 DIAGNOSIS — Z0000 Encounter for general adult medical examination without abnormal findings: Secondary | ICD-10-CM

## 2021-05-02 DIAGNOSIS — E785 Hyperlipidemia, unspecified: Secondary | ICD-10-CM

## 2021-05-02 DIAGNOSIS — E559 Vitamin D deficiency, unspecified: Secondary | ICD-10-CM

## 2021-05-02 DIAGNOSIS — R030 Elevated blood-pressure reading, without diagnosis of hypertension: Secondary | ICD-10-CM

## 2021-05-02 DIAGNOSIS — Z6834 Body mass index (BMI) 34.0-34.9, adult: Secondary | ICD-10-CM

## 2021-05-02 NOTE — Progress Notes (Signed)
[employer] insurance premium discount evaluation: Labs Drawn. Replacements ROI form signed. Tobacco Free Attestation form signed.  Forms placed in paper chart.  

## 2021-05-03 ENCOUNTER — Encounter: Payer: Self-pay | Admitting: Registered Nurse

## 2021-05-03 LAB — CMP12+LP+TP+TSH+6AC+CBC/D/PLT
ALT: 18 IU/L (ref 0–32)
AST: 18 IU/L (ref 0–40)
Albumin/Globulin Ratio: 1.9 (ref 1.2–2.2)
Albumin: 4.6 g/dL (ref 3.8–4.8)
Alkaline Phosphatase: 89 IU/L (ref 44–121)
BUN/Creatinine Ratio: 14 (ref 9–23)
BUN: 11 mg/dL (ref 6–24)
Basophils Absolute: 0 10*3/uL (ref 0.0–0.2)
Basos: 1 %
Bilirubin Total: 0.4 mg/dL (ref 0.0–1.2)
Calcium: 9.6 mg/dL (ref 8.7–10.2)
Chloride: 101 mmol/L (ref 96–106)
Chol/HDL Ratio: 3.5 ratio (ref 0.0–4.4)
Cholesterol, Total: 243 mg/dL — ABNORMAL HIGH (ref 100–199)
Creatinine, Ser: 0.77 mg/dL (ref 0.57–1.00)
EOS (ABSOLUTE): 0.1 10*3/uL (ref 0.0–0.4)
Eos: 1 %
Estimated CHD Risk: 0.6 times avg. (ref 0.0–1.0)
Free Thyroxine Index: 1.9 (ref 1.2–4.9)
GGT: 12 IU/L (ref 0–60)
Globulin, Total: 2.4 g/dL (ref 1.5–4.5)
Glucose: 81 mg/dL (ref 70–99)
HDL: 69 mg/dL (ref >39)
Hematocrit: 40.4 % (ref 34.0–46.6)
Hemoglobin: 13.8 g/dL (ref 11.1–15.9)
Immature Grans (Abs): 0 10*3/uL (ref 0.0–0.1)
Immature Granulocytes: 0 %
Iron: 151 ug/dL (ref 27–159)
LDH: 196 IU/L (ref 119–226)
LDL Chol Calc (NIH): 156 mg/dL — ABNORMAL HIGH (ref 0–99)
Lymphocytes Absolute: 1.9 10*3/uL (ref 0.7–3.1)
Lymphs: 35 %
MCH: 29.7 pg (ref 26.6–33.0)
MCHC: 34.2 g/dL (ref 31.5–35.7)
MCV: 87 fL (ref 79–97)
Monocytes Absolute: 0.5 10*3/uL (ref 0.1–0.9)
Monocytes: 9 %
Neutrophils Absolute: 3 10*3/uL (ref 1.4–7.0)
Neutrophils: 54 %
Phosphorus: 3.2 mg/dL (ref 3.0–4.3)
Platelets: 339 10*3/uL (ref 150–450)
Potassium: 4.2 mmol/L (ref 3.5–5.2)
RBC: 4.64 x10E6/uL (ref 3.77–5.28)
RDW: 11.9 % (ref 11.7–15.4)
Sodium: 139 mmol/L (ref 134–144)
T3 Uptake Ratio: 25 % (ref 24–39)
T4, Total: 7.5 ug/dL (ref 4.5–12.0)
TSH: 1.77 u[IU]/mL (ref 0.450–4.500)
Total Protein: 7 g/dL (ref 6.0–8.5)
Triglycerides: 104 mg/dL (ref 0–149)
Uric Acid: 4.6 mg/dL (ref 2.6–6.2)
VLDL Cholesterol Cal: 18 mg/dL (ref 5–40)
WBC: 5.4 10*3/uL (ref 3.4–10.8)
eGFR: 95 mL/min/{1.73_m2} (ref >59)

## 2021-05-03 LAB — VITAMIN D 25 HYDROXY (VIT D DEFICIENCY, FRACTURES): Vit D, 25-Hydroxy: 27.3 ng/mL — ABNORMAL LOW (ref 30.0–100.0)

## 2021-05-03 LAB — HGB A1C W/O EAG: Hgb A1c MFr Bld: 5.2 % (ref 4.8–5.6)

## 2021-05-03 NOTE — Addendum Note (Signed)
Addended by: Albina Billet A on: 05/03/2021 09:21 AM ? ? Modules accepted: Orders, Level of Service ? ?

## 2021-05-03 NOTE — Progress Notes (Signed)
Results reviewed with pt in clinic. She sts she is taking a One-A-Day multivitamin that has 1000 units of Vit D in it. She has been outside a lot recently but confirms in long pants and either tshirt or jacket. Suggested adding in another separate 1000 unit supplement to take her up to 2000 units/day. Then as the weather warms up she plans to have more sun exposure on skin. ?BP rechecked today and improved after sitting a little more than 5 minutes. Sts her diet is similar to what she was eating in 2017-2018 when she had her larger weight loss. But does endorse her portion sizes being larger than previous and is working on this. She also eats kale and other fibrous vegetables 5-7 days a week and limits sugar intake. ?Appt made for vitamin D, BP and Wt recheck 3 months from now and emailed to pt. ?Routine f/u with pcp. Unable to provide hard copy of labs due to computer failure in clinic. Results routed to pcp per pt request. No further questions/concerns.

## 2021-05-06 NOTE — Progress Notes (Signed)
Reviewed RN Hildred Alamin note agreed with increasing vitamin D to 2000 units daily, activity and monitoring portion sizes and repeat BP, weight, vitamin D in 3 months ?

## 2021-08-08 ENCOUNTER — Other Ambulatory Visit: Payer: Self-pay | Admitting: Registered Nurse

## 2021-08-08 DIAGNOSIS — E559 Vitamin D deficiency, unspecified: Secondary | ICD-10-CM

## 2021-08-09 ENCOUNTER — Encounter: Payer: Self-pay | Admitting: Registered Nurse

## 2021-08-09 ENCOUNTER — Telehealth: Payer: Self-pay | Admitting: Registered Nurse

## 2021-08-09 DIAGNOSIS — E559 Vitamin D deficiency, unspecified: Secondary | ICD-10-CM

## 2021-08-09 LAB — VITAMIN D 25 HYDROXY (VIT D DEFICIENCY, FRACTURES): Vit D, 25-Hydroxy: 25.7 ng/mL — ABNORMAL LOW (ref 30.0–100.0)

## 2021-08-09 MED ORDER — CHOLECALCIFEROL 1.25 MG (50000 UT) PO TABS: 1.0000 | ORAL_TABLET | ORAL | 0 refills | Status: AC

## 2021-08-09 NOTE — Telephone Encounter (Signed)
My chart message sent to patient with results today and instructions  Madison Garner,  Your vitamin D level was still a bit low.  Results reviewed in [facility] portal and copied below.  I sent in a new Rx to start cholecalciferol 50,000 units by mouth weekly.  Repeat nonfasting Vitamin D level in 3 months.  Please let me know if you have further questions.  Sincerely,  Albina Billet NP-C  Vitamin D, 25-Hydroxy  25.7 Vitamin D deficiency has been defined by the Institute of Medicine and an Endocrine Society practice guideline as a level of serum 25-OH vitamin D less than 20 ng/mL (1,2). The Endocrine Society went on to further define vitamin D insufficiency as a level between 21 and 29 ng/mL (2). 1. IOM (Institute of Medicine). 2010. Dietary reference    intakes for calcium and D. [place]: The    [employer]. 2. Holick MF, Binkley Bladen, Bischoff-Ferrari HA, et al.    Evaluation, treatment, and prevention of vitamin D    deficiency: an Endocrine Society clinical practice    guideline. JCEM. 2011 Jul; 96(7):1911-30. Low ng/mL 30.0 - 100.0   Patient also asked about nondairy protein sources yesterday in clinic.  Discussed gatorade has 10g protein sports drink now nondairy.  She is considering starting use after her runs.

## 2021-08-26 ENCOUNTER — Telehealth: Payer: Self-pay | Admitting: Registered Nurse

## 2021-08-26 ENCOUNTER — Encounter: Payer: Self-pay | Admitting: Registered Nurse

## 2021-08-26 DIAGNOSIS — R3915 Urgency of urination: Secondary | ICD-10-CM

## 2021-08-26 MED ORDER — PHENAZOPYRIDINE HCL 200 MG PO TABS: 200.0000 mg | ORAL_TABLET | Freq: Three times a day (TID) | ORAL | 0 refills | Status: DC

## 2021-08-26 NOTE — Telephone Encounter (Signed)
Patient left message for NP requesting appt today.  Contacted patient via telephone and she stated yesterday started to have urinary urgency denied back pain, fever, chills, hematuria, cloudy or smelly urine.  Has increased water intake today and symptom improving.  Discussed with patient can start OTC azo 1-2 tabs po q8h prn dysuria.  Discussed with patient continue drinking water and if new or worsening symptoms eg frequency, burning start azo generic equivalent and if prior to 4pm see RN Reece Packer in clinic to give urine sample.  Contacted RN Katrinka Blazing and discussed send urine culture to [facility] and perform dipstick urinalysis today if patient with worsening symptoms.  Patient to go to UC/ER if gross hematuria, back pain, repetitive vomiting, fever or chills.  Patient stated she will come to clinic for appt tomorrow if still having symptoms.  Patient A&Ox3 spoke full sentences without difficulty; no cough/congestion/throat clearing audible during 5 minute call.  Exitcare handout on dysuria sent to my chart.  Patient verbalized understanding information/instructions, agreed with plan of care and had no further questions at this time.

## 2021-08-27 ENCOUNTER — Ambulatory Visit: Payer: No Typology Code available for payment source | Admitting: Registered Nurse

## 2021-08-27 ENCOUNTER — Other Ambulatory Visit: Payer: Self-pay | Admitting: Registered Nurse

## 2021-08-27 ENCOUNTER — Encounter: Payer: Self-pay | Admitting: Registered Nurse

## 2021-08-27 VITALS — BP 142/92 | HR 76 | Temp 97.8°F | Resp 16

## 2021-08-27 DIAGNOSIS — R350 Frequency of micturition: Secondary | ICD-10-CM

## 2021-08-27 NOTE — Progress Notes (Signed)
Subjective:    Patient ID: Madison Garner, female    DOB: [birth year], 51 y.o.   MRN: [id number]  50y/o caucasian female established patient started out with urinary urgency 8/6 increased water intake yesterday and symptoms decreased but worsened to frequency and decreased urine amount with each void today.  Denied fever/chills/back pain/cloudy/smelly urine/blood/n/v/d.  Urine clear yellow "My normal color"      Review of Systems  Constitutional:  Negative for activity change, appetite change, chills, diaphoresis, fatigue and fever.  HENT:  Negative for trouble swallowing and voice change.   Respiratory:  Negative for cough.   Cardiovascular:  Negative for leg swelling.  Gastrointestinal:  Negative for abdominal distention, anal bleeding, blood in stool, constipation, diarrhea, nausea, rectal pain and vomiting.  Genitourinary:  Positive for decreased urine volume, dysuria, frequency and urgency. Negative for difficulty urinating, dyspareunia, enuresis, flank pain, genital sores, hematuria, menstrual problem, pelvic pain, vaginal bleeding, vaginal discharge and vaginal pain.  Musculoskeletal:  Negative for back pain, gait problem, joint swelling and neck stiffness.  Skin:  Negative for color change, pallor, rash and wound.  Allergic/Immunologic: Positive for environmental allergies. Negative for food allergies.  Neurological:  Negative for dizziness, tremors, seizures, syncope, facial asymmetry, speech difficulty, weakness, light-headedness, numbness and headaches.  Hematological:  Negative for adenopathy. Does not bruise/bleed easily.  Psychiatric/Behavioral:  Negative for agitation, confusion and sleep disturbance.        Objective:   Physical Exam Vitals and nursing note reviewed.  Constitutional:      General: She is awake. She is not in acute distress.    Appearance: Normal appearance. She is well-developed, well-groomed and overweight. She is not ill-appearing, toxic-appearing or  diaphoretic.  HENT:     Head: Normocephalic and atraumatic.     Jaw: There is normal jaw occlusion.     Salivary Glands: Right salivary gland is not diffusely enlarged or tender. Left salivary gland is not diffusely enlarged or tender.     Right Ear: Hearing and external ear normal.     Left Ear: Hearing and external ear normal.     Nose: Nose normal. No congestion or rhinorrhea.     Mouth/Throat:     Lips: Pink. No lesions.     Mouth: Mucous membranes are moist. No oral lesions.     Dentition: No gum lesions.     Tongue: No lesions.     Palate: No lesions.     Pharynx: Oropharynx is clear. Uvula midline.  Eyes:     General: Lids are normal. Vision grossly intact. Gaze aligned appropriately. Allergic shiner present. No scleral icterus.       Right eye: No discharge.        Left eye: No discharge.     Extraocular Movements: Extraocular movements intact.     Conjunctiva/sclera: Conjunctivae normal.     Pupils: Pupils are equal, round, and reactive to light.  Neck:     Trachea: Trachea and phonation normal.  Cardiovascular:     Rate and Rhythm: Normal rate and regular rhythm.     Pulses: Normal pulses.          Radial pulses are 2+ on the right side and 2+ on the left side.  Pulmonary:     Effort: Pulmonary effort is normal. No respiratory distress.     Breath sounds: Normal breath sounds and air entry. No stridor or transmitted upper airway sounds. No wheezing.     Comments: Spoke full sentences without difficulty; no cough observed  in exam room Abdominal:     General: Abdomen is flat. Bowel sounds are normal. There is no distension or abdominal bruit. There are no signs of injury.     Palpations: Abdomen is soft. There is no shifting dullness, fluid wave, hepatomegaly, splenomegaly, mass or pulsatile mass.     Tenderness: There is no abdominal tenderness. There is no right CVA tenderness, left CVA tenderness, guarding or rebound. Negative signs include Murphy's sign.     Hernia: No  hernia is present. There is no hernia in the umbilical area or ventral area.     Comments: Dull to percussion x 4 quads; normoactive bowel sounds x 4 quads; standing to sitting to supine and reversed without difficulty on exam table  Musculoskeletal:        General: Normal range of motion.     Cervical back: Normal range of motion and neck supple. No swelling, edema, deformity, erythema, signs of trauma, lacerations or rigidity. No pain with movement.     Thoracic back: No swelling, edema, deformity, signs of trauma, lacerations, spasms or tenderness. Normal range of motion.     Lumbar back: No swelling, edema, deformity, signs of trauma, lacerations, spasms or tenderness. Normal range of motion.     Right lower leg: No edema.     Left lower leg: No edema.  Lymphadenopathy:     Head:     Right side of head: No submandibular or preauricular adenopathy.     Left side of head: No submandibular or preauricular adenopathy.     Cervical: No cervical adenopathy.     Right cervical: No superficial cervical adenopathy.    Left cervical: No superficial cervical adenopathy.  Skin:    General: Skin is warm and dry.     Capillary Refill: Capillary refill takes less than 2 seconds.     Coloration: Skin is not ashen, cyanotic, jaundiced, mottled, pale or sallow.     Findings: No abrasion, bruising, burn, erythema, signs of injury, laceration, lesion, petechiae, rash or wound.     Nails: There is no clubbing.  Neurological:     General: No focal deficit present.     Mental Status: She is alert and oriented to person, place, and time. Mental status is at baseline.     GCS: GCS eye subscore is 4. GCS verbal subscore is 5. GCS motor subscore is 6.     Cranial Nerves: Cranial nerves 2-12 are intact. No cranial nerve deficit, dysarthria or facial asymmetry.     Sensory: Sensation is intact.     Motor: Motor function is intact. No weakness, tremor, atrophy, abnormal muscle tone or seizure activity.      Coordination: Coordination is intact. Coordination normal.     Gait: Gait is intact. Gait normal.     Comments: In/out of chair and on/off exam table without difficulty; gait sure and steady in clinic; bilateral hand grasp equal 5/5  Psychiatric:        Attention and Perception: Attention and perception normal.        Mood and Affect: Mood and affect normal.        Speech: Speech normal.        Behavior: Behavior normal. Behavior is cooperative.        Thought Content: Thought content normal.        Cognition and Memory: Cognition and memory normal.        Judgment: Judgment normal.       Dipstick urinalysis with RN Burna Mortimer  Tamala Julian today negative/normal except 1+ leukocytes patient notified not consistent with UTI may be only bladder irritants at this time increase water may use azo for comfort and will send out urine culture to [place].  Patient verbalized understanding information and had no further questions at this time.    Assessment & Plan:   A-urinary frequency  P-dipstick urinalysis and urine culture sent to [facility] today.  Discussed with patient will send my chart message once results received typically 1-4 days depending on if slow or fast growing bacteria.  Medications as directed. Patient is also to push fluids and may use otc azo/phenazopyridine/Pyridium 100-200mg  po TID as needed. Patient notified will make urine gold/orange colored and can stain porous materials/clothes if urine touches them.  Hydrate, avoid dehydration. Avoid holding urine void on frequent basis every 4 to 6 hours. If unable to void every 8 hours/repetitive vomiting/worsening pain/fever/chills follow up for same day re-evaluation with PCM, urgent care or ER. Call or return to clinic as needed if these symptoms worsen or fail to improve as anticipated. Exitcare handout on dysuria  Patient verbalized agreement and understanding of treatment plan and had no further questions at this time.  P2: Hydrate and cranberry  juice

## 2021-08-27 NOTE — Patient Instructions (Signed)

## 2021-08-29 LAB — URINE CULTURE

## 2021-08-29 NOTE — Telephone Encounter (Signed)
Patient to clinic to notify staff after she decreases water intake around 1900 hours starts having worsening urgency/frequency symptoms.  During the day does not notice symptoms as much.  Denied fever/chills/n/v/abdomen pain/back pain/cloudy/smelly urine.  Asked if urine culture results available checked [facility] portal and mixed urogenital flora 10,000-25,000 colony forming units  Discussed with patient this is usually from urine touching skin when trying to pee in cup.  Recommended to take azo OTC for 2 days continue pushing fluids.  Patient reported will be on vacation until 09/10/21 starting tomorrow.  Patient dispensed bactrim DS po BID x 3 days #40 RF0 from [facility] to take with her on vacation.  If cloudy/smelly urine, fever or chills to start bactrim DS and notify clinic staff.  To go to store of her choice and buy azo also to take with her on vacation to take prn.  Patient verbalized understanding information/instructions, agreed with plan of care and had no further questions at this time.  A&Ox3 spoke full sentences without difficulty skin warm dry and pink gait sure and steady no swelling hands/feet.  Tolerating po intake without difficulty.

## 2021-09-11 ENCOUNTER — Encounter: Payer: Self-pay | Admitting: Registered Nurse

## 2021-09-11 ENCOUNTER — Telehealth: Payer: Self-pay | Admitting: Registered Nurse

## 2021-09-11 DIAGNOSIS — R300 Dysuria: Secondary | ICD-10-CM

## 2021-09-11 NOTE — Telephone Encounter (Signed)
Patient reported last week dysuria worsening, urine amount decreased, cloudy urine started antibiotic that was dispensed from [facility] to her prior to vacation to use if UTI symptoms worsening.  Patient stated all symptoms have now resolved and feeling much better.  Denied dysuria/hematuria/cloudy/smelly urine/back pain/fever/chills/n/v/d.  Patient to follow up for re-evaluation if new or worsening symptoms.  Patient verbalized understanding information/instructions, agreed with plan of care and had no further questions at this time.

## 2021-09-19 ENCOUNTER — Telehealth: Payer: Self-pay | Admitting: Registered Nurse

## 2021-09-19 DIAGNOSIS — N3000 Acute cystitis without hematuria: Secondary | ICD-10-CM

## 2021-09-19 MED ORDER — SULFAMETHOXAZOLE-TRIMETHOPRIM 800-160 MG PO TABS: 1.0000 | ORAL_TABLET | Freq: Two times a day (BID) | ORAL | 0 refills | Status: AC

## 2021-09-19 NOTE — Telephone Encounter (Signed)
Late entry noted Rx had not been entered in Epic from 08/27/21 office visit.  Patient was dispensed Rx on 08/27/21 from [facility] to start if worsening symptoms while on vacation the following week.  Patient did report to me on 09/11/21 she took bactrim DS po BID x 3 days the week prior after her symptoms worsened and all resolved.  Patient denied dysuria/UTI symptoms this week also/continues resolved.

## 2021-09-24 ENCOUNTER — Encounter: Payer: Self-pay | Admitting: Nurse Practitioner

## 2021-09-24 ENCOUNTER — Ambulatory Visit (INDEPENDENT_AMBULATORY_CARE_PROVIDER_SITE_OTHER): Payer: No Typology Code available for payment source | Admitting: Nurse Practitioner

## 2021-09-24 VITALS — BP 118/80 | Ht 59.0 in | Wt 183.0 lb

## 2021-09-24 DIAGNOSIS — Z1211 Encounter for screening for malignant neoplasm of colon: Secondary | ICD-10-CM

## 2021-09-24 DIAGNOSIS — Z780 Asymptomatic menopausal state: Secondary | ICD-10-CM | POA: Diagnosis not present

## 2021-09-24 DIAGNOSIS — Z01419 Encounter for gynecological examination (general) (routine) without abnormal findings: Secondary | ICD-10-CM

## 2021-09-24 NOTE — Progress Notes (Signed)
   Madison Garner March 10, 1970 [id number]   History:  51 y.o. [id number] presents for annual exam. Postmenopausal - no HRT. History of abnormal pap smears, negative colposcopies x2 while pregnant with daughter 19 years ago. Normal paps since. Lost 45 pounds a few years ago with diet and exercise but has gained it back due to injuries and job changes. Has stared walking again and plans to work on diet.   Gynecologic History No LMP recorded. Patient is perimenopausal.   Contraception/Family planning: vasectomy Sexually active: Yes  Health Maintenance Last Pap: 09/19/2020. Results were: Normal, 5-year repeat Last mammogram: 02/12/2021. Results were: Normal Last colonoscopy: Never Last Dexa: Not indicated  Past medical history, past surgical history, family history and social history were all reviewed and documented in the EPIC chart. Boyfriend of 23 years, father of children. 2 daughters ages 80 and 21. Mother with history of cervical cancer in her 27s.   ROS:  A ROS was performed and pertinent positives and negatives are included.  Exam:  Vitals:   09/24/21 0825  BP: 118/80  Weight: 183 lb (83 kg)  Height: 4\' 11"  (1.499 m)    Body mass index is 36.96 kg/m.  General appearance:  Normal Thyroid:  Symmetrical, normal in size, without palpable masses or nodularity. Respiratory  Auscultation:  Clear without wheezing or rhonchi Cardiovascular  Auscultation:  Regular rate, without rubs, murmurs or gallops  Edema/varicosities:  Not grossly evident Abdominal  Soft,nontender, without masses, guarding or rebound.  Liver/spleen:  No organomegaly noted  Hernia:  None appreciated  Skin  Inspection:  Grossly normal Breasts: Examined lying and sitting.   Right: Without masses, retractions, nipple discharge or axillary adenopathy.   Left: Without masses, retractions, nipple discharge or axillary adenopathy. Genitourinary   Inguinal/mons:  Normal without inguinal adenopathy  External  genitalia:  Normal appearing vulva with no masses, tenderness, or lesions  BUS/Urethra/Skene's glands:  Normal  Vagina:  Normal appearing with normal color and discharge, no lesions  Cervix:  Normal appearing without discharge or lesions  Uterus:  Normal in size, shape and contour.  Midline and mobile, nontender  Adnexa/parametria:     Rt: Normal in size, without masses or tenderness.   Lt: Normal in size, without masses or tenderness.  Anus and perineum: Normal  Digital rectal exam: Normal sphincter tone without palpated masses or tenderness  Patient informed chaperone available to be present for breast and pelvic exam. Patient has requested no chaperone to be present. Patient has been advised what will be completed during breast and pelvic exam.   Assessment/Plan:  51 y.o. 45 for annual exam.   Well female exam with routine gynecological exam - Education provided on SBEs, importance of preventative screenings, current guidelines, high calcium diet, regular exercise, and multivitamin daily.  Labs through work.   Screening for colon cancer - Plan: Cologuard. Has not had colon cancer screening. Discussed current guidelines and importance of preventative screenings. Candidate for Cologuard and she is agreeable.   Postmenopausal - no HRT, no bleeding.   Screening for cervical cancer - History of abnormal pap smears, negative colposcopies x2 while pregnant with daughter 19 years ago. No interventions. Normal paps since but they have been infrequent.  Will repeat at 5-year interval per guidelines.   Screening for breast cancer - Normal mammogram history.  Continue annual screenings.  Normal breast exam today.  Return in 1 year for annual.     [id number] DNP, 8:47 AM 09/24/2021

## 2021-10-22 ENCOUNTER — Encounter: Payer: Self-pay | Admitting: Registered Nurse

## 2021-10-22 ENCOUNTER — Telehealth: Payer: Self-pay | Admitting: Registered Nurse

## 2021-10-22 DIAGNOSIS — J301 Allergic rhinitis due to pollen: Secondary | ICD-10-CM

## 2021-10-22 MED ORDER — FEXOFENADINE HCL 180 MG PO TABS: 180.0000 mg | ORAL_TABLET | Freq: Every day | ORAL | 3 refills | Status: DC

## 2021-10-22 NOTE — Telephone Encounter (Signed)
Patient contacted clinic doesn't feel claritin is working well for her any longer.  Had switched 2 years ago from allegra to claritin due to cost savings.  Wants to go back on allegra need Rx to use her FSA money.  Electronic Rx sent to her pharmacy of choice fexofenadine 180mg  po daily #90 RF3.  Patient verbalized understanding information/instructions, agreed with plan of care and had no further questions at this time.

## 2021-11-04 ENCOUNTER — Other Ambulatory Visit: Payer: No Typology Code available for payment source

## 2021-11-04 DIAGNOSIS — E559 Vitamin D deficiency, unspecified: Secondary | ICD-10-CM

## 2021-11-04 NOTE — Progress Notes (Signed)
   Established Patient Office Visit  Subjective   Patient ID: Madison Garner, female    DOB: 15-Dec-1970  Age: 51 y.o. MRN: [id number]  No chief complaint on file.   HPI    ROS    Objective:     There were no vitals taken for this visit.   Physical Exam   No results found for any visits on 11/04/21.    The 10-year ASCVD risk score (Arnett DK, et al., 2019) is: 1%    Assessment & Plan:   Problem List Items Addressed This Visit   None Visit Diagnoses     Vitamin D deficiency           No follow-ups on file.    Darletta Moll, RN

## 2021-11-05 LAB — VITAMIN D 25 HYDROXY (VIT D DEFICIENCY, FRACTURES): Vit D, 25-Hydroxy: 55 ng/mL (ref 30.0–100.0)

## 2021-11-21 ENCOUNTER — Ambulatory Visit: Payer: No Typology Code available for payment source | Admitting: Registered Nurse

## 2021-11-21 ENCOUNTER — Encounter: Payer: Self-pay | Admitting: Registered Nurse

## 2021-11-21 VITALS — BP 135/85 | HR 66 | Temp 97.2°F | Resp 16

## 2021-11-21 DIAGNOSIS — M792 Neuralgia and neuritis, unspecified: Secondary | ICD-10-CM

## 2021-11-21 DIAGNOSIS — M62838 Other muscle spasm: Secondary | ICD-10-CM

## 2021-11-21 MED ORDER — PREDNISONE 10 MG PO TABS: ORAL_TABLET | ORAL | 0 refills | Status: AC

## 2021-11-21 MED ORDER — CYCLOBENZAPRINE HCL 10 MG PO TABS
5.0000 mg | ORAL_TABLET | Freq: Three times a day (TID) | ORAL | 0 refills | Status: AC | PRN
Start: 2021-11-21 — End: 2021-12-01

## 2021-11-21 NOTE — Patient Instructions (Addendum)
Cervical Strain and Sprain Rehab Ask your health care provider which exercises are safe for you. Do exercises exactly as told by your health care provider and adjust them as directed. It is normal to feel mild stretching, pulling, tightness, or discomfort as you do these exercises. Stop right away if you feel sudden pain or your pain gets worse. Do not begin these exercises until told by your health care provider. Stretching and range-of-motion exercises Cervical side bending  Using good posture, sit on a stable chair or stand up. Without moving your shoulders, slowly tilt your left / right ear to your shoulder until you feel a stretch in the neck muscles on the opposite side. You should be looking straight ahead. Hold for _____5-10_____ seconds. Repeat with the other side of your neck. Repeat _____3_____ times. Complete this exercise ___2_______ times a day. Cervical rotation  Using good posture, sit on a stable chair or stand up. Slowly turn your head to the side as if you are looking over your left / right shoulder. Keep your eyes level with the ground. Stop when you feel a stretch along the side and the back of your neck. Hold for ___5-15_______ seconds. Repeat this by turning to your other side. Repeat ____3______ times. Complete this exercise ____2______ times a day. Thoracic extension and pectoral stretch  Roll a towel or a small blanket so it is about 4 inches (10 cm) in diameter. Lie down on your back on a firm surface. Put the towel in the middle of your back across your spine. It should not be under your shoulder blades. Put your hands behind your head and let your elbows fall out to your sides. Hold for __5-15________ seconds. Repeat _____3_____ times. Complete this exercise ______2____ times a day. Strengthening exercises Upper cervical flexion  Lie on your back with a thin pillow behind your head or a small, rolled-up towel under your neck. Gently tuck your chin toward  your chest and nod your head down to look toward your feet. Do not lift your head off the pillow. Hold for ____5-15______ seconds. Release the tension slowly. Relax your neck muscles completely before you repeat this exercise. Repeat _____3_____ times. Complete this exercise ____2______ times a day. Cervical extension  Stand about 6 inches (15 cm) away from a wall, with your back facing the wall. Place a soft object, about 6-8 inches (15-20 cm) in diameter, between the back of your head and the wall. A soft object could be a small pillow, a ball, or a folded towel. Gently tilt your head back and press into the soft object. Keep your jaw and forehead relaxed. Hold for ___5-15_______ seconds. Release the tension slowly. Relax your neck muscles completely before you repeat this exercise. Repeat ____3______ times. Complete this exercise __2________ times a day. Posture and body mechanics Body mechanics refer to the movements and positions of your body while you do your daily activities. Posture is part of body mechanics. Good posture and healthy body mechanics can help to relieve stress in your body's tissues and joints. Good posture means that your spine is in its natural S-curve position (your spine is neutral), your shoulders are pulled back slightly, and your head is not tipped forward. The following are general guidelines for using improved posture and body mechanics in your everyday activities. Sitting  When sitting, keep your spine neutral and keep your feet flat on the floor. Use a footrest, if needed, and keep your thighs parallel to the floor. Avoid rounding  your shoulders. Avoid tilting your head forward. When working at a desk or a computer, keep your desk at a height where your hands are slightly lower than your elbows. Slide your chair under your desk so you are close enough to maintain good posture. When working at a computer, place your monitor at a height where you are looking  straight ahead and you do not have to tilt your head forward or downward to look at the screen. Standing  When standing, keep your spine neutral and keep your feet about hip-width apart. Keep a slight bend in your knees. Your ears, shoulders, and hips should line up. When you do a task in which you stand in one place for a long time, place one foot up on a stable object that is 2-4 inches (5-10 cm) high, such as a footstool. This helps keep your spine neutral. Resting When lying down and resting, avoid positions that are most painful for you. Try to support your neck in a neutral position. You can use a contour pillow or a small rolled-up towel. Your pillow should support your neck but not push on it. This information is not intended to replace advice given to you by your health care provider. Make sure you discuss any questions you have with your health care provider. Document Revised: 07/29/2021 Document Reviewed: 07/29/2021 [employer] Patient Education  2023 [employer] Shoulder Exercises Ask your health care provider which exercises are safe for you. Do exercises exactly as told by your health care provider and adjust them as directed. It is normal to feel mild stretching, pulling, tightness, or discomfort as you do these exercises. Stop right away if you feel sudden pain or your pain gets worse. Do not begin these exercises until told by your health care provider. Stretching exercises External rotation and abduction This exercise is sometimes called corner stretch. The exercise rotates your arm outward (external rotation) and moves your arm out from your body (abduction). Stand in a doorway with one of your feet slightly in front of the other. This is called a staggered stance. If you cannot reach your forearms to the door frame, stand facing a corner of a room. Choose one of the following positions as told by your health care provider: Place your hands and forearms on the door frame above  your head. Place your hands and forearms on the door frame at the height of your head. Place your hands on the door frame at the height of your elbows. Slowly move your weight onto your front foot until you feel a stretch across your chest and in the front of your shoulders. Keep your head and chest upright and keep your abdominal muscles tight. Hold for __5-15________ seconds. To release the stretch, shift your weight to your back foot. Repeat _____3_____ times. Complete this exercise ____2______ times a day. Extension, standing  Stand and hold a broomstick, a cane, or a similar object behind your back. Your hands should be a little wider than shoulder-width apart. Your palms should face away from your back. Keeping your elbows straight and your shoulder muscles relaxed, move the stick away from your body until you feel a stretch in your shoulders (extension). Avoid shrugging your shoulders while you move the stick. Keep your shoulder blades tucked down toward the middle of your back. Hold for ___5-15_______ seconds. Slowly return to the starting position. Repeat _____3_____ times. Complete this exercise ___2_______ times a day. Range-of-motion exercises Pendulum  Stand near a wall or a  surface that you can hold onto for balance. Bend at the waist and let your left / right arm hang straight down. Use your other arm to support you. Keep your back straight and do not lock your knees. Relax your left / right arm and shoulder muscles, and move your hips and your trunk so your left / right arm swings freely. Your arm should swing because of the motion of your body, not because you are using your arm or shoulder muscles. Keep moving your hips and trunk so your arm swings in the following directions, as told by your health care provider: Side to side. Forward and backward. In clockwise and counterclockwise circles. Continue each motion for ___5-15_______ seconds, or for as long as told by your  health care provider. Slowly return to the starting position. Repeat _____3_____ times. Complete this exercise ____2______ times a day. Shoulder flexion, standing  Stand and hold a broomstick, a cane, or a similar object. Place your hands a little more than shoulder-width apart on the object. Your left / right hand should be palm-up, and your other hand should be palm-down. Keep your elbow straight and your shoulder muscles relaxed. Push the stick up with your healthy arm to raise your left / right arm in front of your body, and then over your head until you feel a stretch in your shoulder (flexion). Avoid shrugging your shoulder while you raise your arm. Keep your shoulder blade tucked down toward the middle of your back. Hold for ___5-15_______ seconds. Slowly return to the starting position. Repeat ____3______ times. Complete this exercise ___2_______ times a day. Shoulder abduction, standing  Stand and hold a broomstick, a cane, or a similar object. Place your hands a little more than shoulder-width apart on the object. Your left / right hand should be palm-up, and your other hand should be palm-down. Keep your elbow straight and your shoulder muscles relaxed. Push the object across your body toward your left / right side. Raise your left / right arm to the side of your body (abduction) until you feel a stretch in your shoulder. Do not raise your arm above shoulder height unless your health care provider tells you to do that. If directed, raise your arm over your head. Avoid shrugging your shoulder while you raise your arm. Keep your shoulder blade tucked down toward the middle of your back. Hold for ___5-15_______ seconds. Slowly return to the starting position. Repeat ___3_______ times. Complete this exercise __2________ times a day. Internal rotation  Place your left / right hand behind your back, palm-up. Use your other hand to dangle an exercise band, a broomstick, or a similar  object over your shoulder. Grasp the band with your left / right hand so you are holding on to both ends. Gently pull up on the band until you feel a stretch in the front of your left / right shoulder. The movement of your arm toward the center of your body is called internal rotation. Avoid shrugging your shoulder while you raise your arm. Keep your shoulder blade tucked down toward the middle of your back. Hold for ___5-15_______ seconds. Release the stretch by letting go of the band and lowering your hands. Repeat ____3______ times. Complete this exercise ___2_______ times a day. Strengthening exercises External rotation  Sit in a stable chair without armrests. Secure an exercise band to a stable object at elbow height on your left / right side. Place a soft object, such as a folded towel or a small  pillow, between your left / right upper arm and your body to move your elbow about 4 inches (10 cm) away from your side. Hold the end of the exercise band so it is tight and there is no slack. Keeping your elbow pressed against the soft object, slowly move your forearm out, away from your abdomen (external rotation). Keep your body steady so only your forearm moves. Hold for ____5-15______ seconds. Slowly return to the starting position. Repeat ___3_______ times. Complete this exercise __2________ times a day. Shoulder abduction  Sit in a stable chair without armrests, or stand up. Hold a __none________ lb / kg weight in your left / right hand, or hold an exercise band with both hands. Start with your arms straight down and your left / right palm facing in, toward your body. Slowly lift your left / right hand out to your side (abduction). Do not lift your hand above shoulder height unless your health care provider tells you that this is safe. Keep your arms straight. Avoid shrugging your shoulder while you do this movement. Keep your shoulder blade tucked down toward the middle of your  back. Hold for ___5-15_______ seconds. Slowly lower your arm, and return to the starting position. Repeat _____3_____ times. Complete this exercise ___2_______ times a day. Shoulder extension  Sit in a stable chair without armrests, or stand up. Secure an exercise band to a stable object in front of you so it is at shoulder height. Hold one end of the exercise band in each hand. Straighten your elbows and lift your hands up to shoulder height. Squeeze your shoulder blades together as you pull your hands down to the sides of your thighs (extension). Stop when your hands are straight down by your sides. Do not let your hands go behind your body. Hold for __5-15________ seconds. Slowly return to the starting position. Repeat ____3______ times. Complete this exercise _____2_____ times a day. Shoulder row  Sit in a stable chair without armrests, or stand up. Secure an exercise band to a stable object in front of you so it is at chest height. Hold one end of the exercise band in each hand. Position your palms so that your thumbs are facing the ceiling (neutral position). Bend each of your elbows to a 90-degree angle (right angle) and keep your upper arms at your sides. Step back or move the chair back until the band is tight and there is no slack. Slowly pull your elbows back behind you. Hold for ____5-15______ seconds. Slowly return to the starting position. Repeat ____3______ times. Complete this exercise __2________ times a day. Shoulder press-ups  Sit in a stable chair that has armrests. Sit upright, with your feet flat on the floor. Put your hands on the armrests so your elbows are bent and your fingers are pointing forward. Your hands should be about even with the sides of your body. Push down on the armrests and use your arms to lift yourself off the chair. Straighten your elbows and lift yourself up as much as you comfortably can. Move your shoulder blades down, and avoid letting your  shoulders move up toward your ears. Keep your feet on the ground. As you get stronger, your feet should support less of your body weight as you lift yourself up. Hold for _____5-15_____ seconds. Slowly lower yourself back into the chair. Repeat ____3______ times. Complete this exercise ___2_______ times a day. Wall push-ups  Stand so you are facing a stable wall. Your feet should be about  one arm-length away from the wall. Lean forward and place your palms on the wall at shoulder height. Keep your feet flat on the floor as you bend your elbows and lean forward toward the wall. Hold for ___5-15_______ seconds. Straighten your elbows to push yourself back to the starting position. Repeat ______3____ times. Complete this exercise ___2_______ times a day. This information is not intended to replace advice given to you by your health care provider. Make sure you discuss any questions you have with your health care provider. Document Revised: 02/26/2021 Document Reviewed: 02/26/2021 Elsevier Patient Education  2023 [employer] Muscle Cramps and Spasms Muscle cramps and spasms occur when a muscle or muscles tighten and you have no control over this tightening (involuntary muscle contraction). They are a common problem and can develop in any muscle. The most common place is in the calf muscles of the leg. Muscle cramps and muscle spasms are both involuntary muscle contractions, but there are some differences between the two: Muscle cramps are painful. They come and go and may last for a few seconds or up to 15 minutes. Muscle cramps are often more forceful and last longer than muscle spasms. Muscle spasms may or may not be painful. They may also last just a few seconds or much longer. Certain medical conditions, such as diabetes or Parkinson's disease, can make it more likely to develop cramps or spasms. However, cramps or spasms are usually not caused by a serious underlying problem. Common causes  include: Doing more physical work or exercise than your body is ready for (overexertion). Overuse from repeating certain movements too many times. Remaining in a certain position for a long period of time. Improper preparation, form, or technique while playing a sport or doing an activity. Dehydration. Injury. Side effects of some medicines. Abnormally low levels of the salts and minerals in your blood (electrolytes), especially potassium and calcium. This could happen if you are taking water pills (diuretics) or if you are pregnant. In many cases, the cause of muscle cramps or spasms is not known. Follow these instructions at home: Managing pain and stiffness     Try massaging, stretching, and relaxing the affected muscle. Do this for several minutes at a time. If directed, apply heat to tight or tense muscles as often as told by your health care provider. Use the heat source that your health care provider recommends, such as a moist heat pack or a heating pad. Place a towel between your skin and the heat source. Leave the heat on for 20-30 minutes. Remove the heat if your skin turns bright red. This is especially important if you are unable to feel pain, heat, or cold. You may have a greater risk of getting burned. If directed, put ice on the affected area. This may help if you are sore or have pain after a cramp or spasm. Put ice in a plastic bag. Place a towel between your skin and the bag. Leave the ice on for 20 minutes, 2-3 times a day. Try taking hot showers or baths to help relax tight muscles. Eating and drinking Drink enough fluid to keep your urine pale yellow. Staying well hydrated may help prevent cramps or spasms. Eat a healthy diet that includes plenty of nutrients to help your muscles function. A healthy diet includes fruits and vegetables, lean protein, whole grains, and low-fat or nonfat dairy products. General instructions If you are having frequent cramps, avoid  intense exercise for several days. Take over-the-counter and prescription  medicines only as told by your health care provider. Pay attention to any changes in your symptoms. Keep all follow-up visits as told by your health care provider. This is important. Contact a health care provider if: Your cramps or spasms get more severe or happen more often. Your cramps or spasms do not improve over time. Summary Muscle cramps and spasms occur when a muscle or muscles tighten and you have no control over this tightening (involuntary muscle contraction). The most common place for cramps or spasms to occur is in the calf muscles of the leg. Massaging, stretching, and relaxing the affected muscle may relieve the cramp or spasm. Drink enough fluid to keep your urine pale yellow. Staying well hydrated may help prevent cramps or spasms. This information is not intended to replace advice given to you by your health care provider. Make sure you discuss any questions you have with your health care provider. Document Revised: 07/27/2020 Document Reviewed: 07/27/2020 Elsevier Patient Education  2023 [employer] Pinched Nerve A pinched nerve is an injury that occurs when too much pressure is placed on a nerve. This pressure can cause pain, burning, and muscle weakness in places that the nerve supplies feeling to, such as an arm, hand, or leg, or the back or neck. If a nerve is severely pinched or has been pinched for a long time, permanent nerve damage can occur. What are the causes? This condition may be caused by: A nerve passing through a narrow area between bones or other body structures. Arthritis that causes bones to press on a nerve. Loss of blood supply to a nerve. A nerve being stretched from an injury. A sudden injury with swelling. Long-term wear on the nerve. Age-related changes in the spine. What are the signs or symptoms? The most common symptoms of a pinched nerve are feeling a tingling  sensation and numbness. Other symptoms include: Pain that spreads from one area of the body part to another. A burning feeling. Muscle weakness. How is this diagnosed? This condition may be diagnosed based on: A physical exam. During the exam, your health care provider will: Check for numbness and muscle weakness. Move affected body parts to test for pain. X-rays to check for bone damage. An MRI or CT scan to check for conditions that may be causing nerve damage. An electromyogram and nerve conduction study to evaluate how your muscles and nerves communicate. How is this treated? A pinched nerve is usually treated first by: Resting the affected body area. Using devices to help you move without pain (supportive or protective devices), such as a splint, brace, or neck collar. Other treatments depend on your symptoms and the amount of nerve damage you have. Other treatments may include: Medicines, such as: Injections of numbing medicine. NSAIDs. Pain medicines. Steroid medicines. These may be given as a pill or as an injection. Physical therapy to relieve pain, maintain movement, and improve muscle strength. Surgery. This may be done if other treatments do not work. Follow these instructions at home: If you have a removable collar, splint, or brace: Wear the collar, splint, or brace as told by your health care provider. Remove it only as told by your health care provider. Check the skin around the collar, splint, or brace every day. Tell your health care provider about any concerns. Loosen the collar, splint, or brace if any part of your body tingles, becomes numb, or turns cold and blue. Keep the collar, splint or brace clean. If the collar, splint, or  brace is not waterproof: Do not let it get wet. Cover it with a watertight covering when you take a bath or shower. Ask your health care provider when it is safe to drive if you have a collar, splint, or brace. Managing pain and  stiffness     If directed, put ice on the affected area. To do this: If you have a removable collar, splint, or brace, remove it as told by your health care provider. Put ice in a plastic bag. Place a towel between your skin and the bag. Leave the ice on for 20 minutes, 2-3 times a day. Remove the ice if your skin turns bright red. This is very important. If you cannot feel pain, heat, or cold, you have a greater risk of damage to the area. If directed, apply heat to the affected area before you exercise. Use the heat source that your health care provider recommends, such as a moist heat pack or a heating pad. Place a towel between your skin and the heat source. Leave the heat on for 20-30 minutes. Remove the heat if your skin turns bright red. This is especially important if you are unable to feel pain, heat, or cold. You may have a greater risk of getting burned. General instructions Take over-the-counter and prescription medicines only as told by your health care provider. Wear supportive or protective devices as told by your health care provider. Do physical therapy exercises as directed by your health care provider or physical therapist. Return to your normal activities as told by your health care provider. Ask your health care provider what activities are safe for you. Rest as needed. Keep all follow-up visits. This is important. Where to find more information [employer] of Neurological Disorders and Stroke: [employer] Contact a health care provider if: Your condition does not improve with treatment. Your pain, numbness, or weakness suddenly gets worse. You have new weakness in your arms or legs. Get help right away if you: Have loss of bladder control (urinary incontinence) or you cannot urinate. Cannot control bowel movements (fecal incontinence). These symptoms may be an emergency. Get help right away. Call 911. Do not wait to see if the symptoms will go  away. Do not drive yourself to the hospital. Summary A pinched nerve is an injury that occurs when too much pressure is placed on a nerve. This pressure can cause pain, burning, and muscle weakness in places that the nerve supplies feeling to, such as an arm, hand, or leg, or the back or neck. Take over-the-counter and prescription medicines only as told by your health care provider. Ask your health care provider what activities are safe for you. This information is not intended to replace advice given to you by your health care provider. Make sure you discuss any questions you have with your health care provider. Document Revised: 09/17/2020 Document Reviewed: 09/17/2020 Else[employer] Patient Education  [place]. Radicular Pain Radicular pain is a type of pain that spreads from your back or neck along a spinal nerve. Spinal nerves are nerves that leave the spinal cord and go to the muscles. Radicular pain is sometimes called radiculopathy, radiculitis, or a pinched nerve. When you have this type of pain, you may also have weakness, numbness, or tingling in the area of your body that is supplied by the nerve. The pain may feel sharp and burning. Depending on which spinal nerve is affected, the pain may occur in the: Neck area (cervical radicular pain). You may  also feel pain, numbness, weakness, or tingling in the arms. Mid-spine area (thoracic radicular pain). You would feel this pain in the back and chest. This type is rare. Lower back area (lumbar radicular pain). You would feel this pain as low back pain. You may feel pain, numbness, weakness, or tingling in the buttocks or legs. Sciatica is a type of lumbar radicular pain that shoots down the back of the leg. Radicular pain occurs when one of the spinal nerves becomes irritated or squeezed (compressed). It is often caused by something pushing on a spinal nerve, such as one of the bones of the spine (vertebrae) or one of the round cushions  between vertebrae (intervertebral disks). This can result from: An injury. Wear and tear or aging of a disk. The growth of a bone spur that pushes on the nerve. Radicular pain often goes away when you follow instructions from your health care provider for relieving pain at home. How is this treated? Treatment may depend on the cause of the condition and may include: Working with a physical therapist. Taking pain medicine. Applying heat or ice or both to the affected areas. Doing stretches to improve flexibility. Having surgery. This may be needed if other treatments do not help. Different types of surgery may be done depending on the cause of this condition. Follow these instructions at home: Managing pain     If directed, put ice on the affected area. To do this: Put ice in a plastic bag. Place a towel between your skin and the bag. Leave the ice on for 20 minutes, 2-3 times a day. Remove the ice if your skin turns bright red. This is very important. If you cannot feel pain, heat, or cold, you have a greater risk of damage to the area. If directed, apply heat to the affected area as often as told by your health care provider. Use the heat source that your health care provider recommends, such as a moist heat pack or a heating pad. Place a towel between your skin and the heat source. Leave the heat on for 20-30 minutes. Remove the heat if your skin turns bright red. This is especially important if you are unable to feel pain, heat, or cold. You have a greater risk of getting burned. Activity Do not sit or rest in bed for long periods of time. Try to stay as active as possible. Ask your health care provider what type of exercise or activity is best for you. Avoid activities that make your pain worse, such as bending and lifting. You may have to avoid lifting. Ask your health care provider how much you can safely lift. Practice using proper technique when lifting items. Proper lifting  technique involves bending your knees and rising up. Do strength and range-of-motion exercises only as told by your health care provider or physical therapist. General instructions Take over-the-counter and prescription medicines only as told by your health care provider. Pay attention to any changes in your symptoms. Keep all follow-up visits. This is important. Contact a health care provider if: Your pain and other symptoms get worse. Your pain medicine is not helping. Your pain has not improved after a few weeks of home care. You have a fever. Get help right away if: You have severe pain, weakness, or numbness. You have difficulty with bladder or bowel control. Summary Radicular pain is a type of pain that spreads from your back or neck along a spinal nerve. When you have radicular pain, you  may also have weakness, numbness, or tingling in the area of your body that is supplied by the nerve. The pain may feel sharp or burning. Radicular pain may be treated with ice, heat, medicines, or physical therapy. This information is not intended to replace advice given to you by your health care provider. Make sure you discuss any questions you have with your health care provider. Document Revised: 07/12/2020 Document Reviewed: 07/12/2020 Elsevier Patient Education  2023 [employer].

## 2021-11-21 NOTE — Progress Notes (Signed)
Subjective:    Patient ID: Madison Garner, female    DOB: 11/27/1970, 51 y.o.   MRN: [id number]  51y/o caucasian female established patient here for evaluation left shoulder/arm and neck pain.  Has noticed neck muscles tight taking old cyclobenzaprine to help her sleep through the night but still waking up.  Started with naproxen but spoke to [employer] earlier in the week who recommended switching to ibuprofen 800mg  po TID and tylenol 1000mg  po QID and patient reported stretching/heat plus ibuprofen/tylenol regimen helping the most.  Most sore after sleeping.  Has been adjusting her mouse position on desk and trying to be cognizant of her ergonomics when working on computer.  Doing gentle range of motion.  45 minute drive to/from work the other most painful time of day.  Turning head to left sometimes causing sharp pain into left arm 8/10.  At rest right now 2/10 neck/shoulder pain.  Denied swelling/bruising, loss of bowel/bladder control, saddle paresthesias or arm/leg weakness.  Sometimes feels tingly/numb into fingers.  Denied known injury/trauma/blue fingers/arm/bruising/rash.  Biofreeze didn't help when trialed last week.      Review of Systems  Constitutional:  Negative for activity change, appetite change, chills, diaphoresis, fatigue and fever.  HENT:  Negative for trouble swallowing and voice change.   Eyes:  Negative for photophobia and visual disturbance.  Respiratory:  Negative for cough.   Cardiovascular:  Negative for chest pain.  Gastrointestinal:  Negative for diarrhea and vomiting.  Genitourinary:  Negative for difficulty urinating, dysuria and enuresis.  Musculoskeletal:  Positive for arthralgias, myalgias and neck pain. Negative for back pain, gait problem, joint swelling and neck stiffness.  Skin:  Negative for color change, pallor, rash and wound.  Allergic/Immunologic: Positive for environmental allergies. Negative for food allergies.  Neurological:  Positive for  numbness. Negative for dizziness, tremors, seizures, syncope, facial asymmetry, speech difficulty, weakness, light-headedness and headaches.  Hematological:  Negative for adenopathy. Does not bruise/bleed easily.  Psychiatric/Behavioral:  Positive for sleep disturbance. Negative for agitation and confusion.        Objective:   Physical Exam Vitals and nursing note reviewed.  Constitutional:      General: She is awake. She is not in acute distress.    Appearance: Normal appearance. She is well-developed, well-groomed and overweight. She is not ill-appearing, toxic-appearing or diaphoretic.  HENT:     Head: Normocephalic and atraumatic.     Jaw: There is normal jaw occlusion.     Salivary Glands: Right salivary gland is not diffusely enlarged or tender. Left salivary gland is not diffusely enlarged or tender.     Right Ear: Hearing and external ear normal.     Left Ear: Hearing and external ear normal.     Nose: Nose normal. No congestion or rhinorrhea.     Mouth/Throat:     Lips: Pink. No lesions.     Mouth: Mucous membranes are moist.     Dentition: No gum lesions.     Tongue: No lesions. Tongue does not deviate from midline.     Palate: No mass and lesions.     Pharynx: Oropharynx is clear. Uvula midline. No uvula swelling.     Tonsils: No tonsillar exudate.  Eyes:     General: Lids are normal. Vision grossly intact. Gaze aligned appropriately. Allergic shiner present. No scleral icterus.       Right eye: No discharge.        Left eye: No discharge.     Extraocular Movements: Extraocular  movements intact.     Conjunctiva/sclera: Conjunctivae normal.     Pupils: Pupils are equal, round, and reactive to light.  Neck:     Trachea: Trachea and phonation normal. No tracheal deviation.     Comments: Bilateral trapezius tight/tense along with cervical and thoracic T1-T4 paraspinals; left rotation more restricted than right; bilateral lateral bending equally restrictions and unable to  touch chin to chest flexion and extension also restricted due to pain with AROM; bilateral hand grasp equal 5/5; negative NEERs, empty beer can, internal/external rotation normal bilateral shoulders, atchley scratch and lift off tests normal Cardiovascular:     Rate and Rhythm: Normal rate and regular rhythm.     Pulses:          Radial pulses are 2+ on the right side and 2+ on the left side.  Pulmonary:     Effort: Pulmonary effort is normal.     Breath sounds: Normal breath sounds and air entry. No stridor or transmitted upper airway sounds. No wheezing.     Comments: Spoke full sentences without difficulty; no cough observed in exam room Abdominal:     General: Abdomen is flat.  Musculoskeletal:     Right shoulder: No swelling, deformity, effusion, laceration, tenderness, bony tenderness or crepitus. Normal range of motion. Normal strength.     Left shoulder: No swelling, deformity, effusion, laceration, tenderness, bony tenderness or crepitus. Normal range of motion. Normal strength.     Right elbow: No swelling, deformity, effusion or lacerations. Normal range of motion. No tenderness.     Left elbow: No swelling, deformity, effusion or lacerations. Normal range of motion. No tenderness.     Right wrist: No swelling, deformity, effusion, lacerations, tenderness or crepitus. Normal range of motion.     Left wrist: No swelling, deformity, effusion, lacerations, tenderness or crepitus. Normal range of motion.     Right hand: No swelling, deformity or lacerations. Normal range of motion. Normal strength. Normal capillary refill.     Left hand: No swelling, deformity or lacerations. Normal range of motion. Normal strength. Normal capillary refill.     Cervical back: Neck supple. Spasms and tenderness present. No swelling, edema, deformity, erythema, signs of trauma, lacerations, rigidity, torticollis, bony tenderness or crepitus. Pain with movement present. No spinous process tenderness or  muscular tenderness. Decreased range of motion.     Thoracic back: Spasms and tenderness present. No swelling, edema, deformity, signs of trauma, lacerations or bony tenderness. Normal range of motion.     Lumbar back: No swelling, edema, deformity, signs of trauma, lacerations, spasms or tenderness. Normal range of motion.     Right ankle: No swelling.     Left ankle: No swelling.  Lymphadenopathy:     Head:     Right side of head: No submandibular or preauricular adenopathy.     Left side of head: No submandibular or preauricular adenopathy.     Cervical:     Right cervical: No superficial cervical adenopathy.    Left cervical: No superficial cervical adenopathy.  Skin:    General: Skin is warm and dry.     Capillary Refill: Capillary refill takes less than 2 seconds.     Coloration: Skin is not ashen, cyanotic, jaundiced, mottled, pale or sallow.     Findings: No abrasion, abscess, acne, bruising, burn, ecchymosis, erythema, signs of injury, laceration, lesion, petechiae, rash or wound.     Nails: There is no clubbing.  Neurological:     General: No focal deficit  present.     Mental Status: She is alert and oriented to person, place, and time. Mental status is at baseline.     GCS: GCS eye subscore is 4. GCS verbal subscore is 5. GCS motor subscore is 6.     Cranial Nerves: Cranial nerves 2-12 are intact. No cranial nerve deficit, dysarthria or facial asymmetry.     Sensory: Sensation is intact.     Motor: Motor function is intact. No weakness, tremor, atrophy, abnormal muscle tone or seizure activity.     Coordination: Coordination is intact. Coordination normal.     Gait: Gait is intact. Gait normal.     Comments: In/out of chair without difficulty; gait sure and steady in clinic; bilateral hand grasp equal 5/5  Psychiatric:        Attention and Perception: Attention and perception normal.        Mood and Affect: Mood and affect normal.        Speech: Speech normal.         Behavior: Behavior normal. Behavior is cooperative.        Thought Content: Thought content normal.        Cognition and Memory: Cognition and memory normal.        Judgment: Judgment normal.           Assessment & Plan:   A-muscle spasms of neck, radicular pain of left arm initial visit  P-cyclobenazeprine/flexeril 10mg  take 1/2-1 tab po TID prn muscle spasms #30 RF0 dispensed from [facility] to patient.  Pick either naproxen 500mg  po BID or  Ibuprofen 800mg  po TID prn pain do not take both in same day.  Prednisone 10mg  po taper with breakfast 30mg  x 2 days, 20mg  x 2 days then 10mg  x 2 days #21 RF0 dispensed from [facility].  Avoid alcohol intake and driving while taking cyclobenazeprine/flexeril as drowsiness common side effect.  Slow position changes as medication also lower blood pressure.  Home stretches demonstrated to patient-e.g. Arm circles, walking up wall, chest stretches, neck AROM, chin tucks, knee to chest and rock side to side on back. Self massage or professional prn, foam roller use or tennis/racquetball.  Heat/cryotherapy 15 minutes QID prn.  Trial thermacare 1 applied left shoulder/neck and another given to patient for use tomorrow from clinic stock.  Consider physical therapy referral if no improvement with prescribed therapy from [facility] and/or chiropractic care.  Ensure ergonomics correct desk at work avoid repetitive motions if possible/holding phone/laptop in hand use desk/stand and/or break up lifting items into smaller loads/weights.  Patient was instructed to rest, ice, and ROM exercises.  Activity as tolerated.   Follow up if symptoms persist or worsen especially if loss of bowel/bladder control, arm/leg weakness and/or saddle paresthesias.  [facility] handout on radicular pain, shoulder exercises, neck strain exercises and muscle spasms given to patient.  Patient verbalized agreement and understanding of treatment plan and had no further questions at this time.  P2:  Injury Prevention and  [employer].

## 2022-01-08 ENCOUNTER — Ambulatory Visit: Payer: No Typology Code available for payment source | Admitting: Occupational Medicine

## 2022-01-08 DIAGNOSIS — M79671 Pain in right foot: Secondary | ICD-10-CM

## 2022-01-08 NOTE — Progress Notes (Signed)
Patient reports last night doing normal plantar fascitis exercises and heard pop. Since then pain is worse to left ankle that shoots up leg with pressure or movement. Feels like both feet plantar fascitis is worse due to helping on the floor at work. Pt taking aleve for pain. Wrapped left ankle in ace wrap to limit movement. Given re useable Ice pack. Schedule to see Leane Loring NP tomorrow. If worsens know has to go to UC. Patient reports pain 0/10 with no movement. With movement pain increases 3/10 to 10/10

## 2022-01-09 ENCOUNTER — Encounter: Payer: Self-pay | Admitting: Registered Nurse

## 2022-01-09 ENCOUNTER — Ambulatory Visit: Payer: Self-pay | Admitting: Registered Nurse

## 2022-01-09 VITALS — BP 139/91 | HR 81 | Resp 16

## 2022-01-09 DIAGNOSIS — S86819A Strain of other muscle(s) and tendon(s) at lower leg level, unspecified leg, initial encounter: Secondary | ICD-10-CM

## 2022-01-09 MED ORDER — ACETAMINOPHEN 500 MG PO TABS: 1000.0000 mg | ORAL_TABLET | Freq: Four times a day (QID) | ORAL | 0 refills | Status: AC | PRN

## 2022-01-09 MED ORDER — NAPROXEN SODIUM 220 MG PO TABS: 220.0000 mg | ORAL_TABLET | Freq: Two times a day (BID) | ORAL | Status: AC | PRN

## 2022-01-09 NOTE — Progress Notes (Signed)
Subjective:    Patient ID: Madison Garner, female    DOB: Jun 04, 1970, 51 y.o.   MRN: [id number]  51y/o caucasian female established patient here for evaluation left calf pain.  Heard pop yesterday when walking and immediate pain.  "Felt like when I strained right a year ago"  Denied swelling/bruising/rash/defect.  Achilles tendon mildly sore along with calf.  Hasn't used ladders today.  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  Using aleve and it is helping some.  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      Review of Systems  Constitutional:  Positive for activity change. Negative for appetite change, chills, fatigue and fever.  Respiratory:  Negative for cough.   Cardiovascular:  Negative for leg swelling.  Musculoskeletal:  Positive for gait problem and myalgias. Negative for arthralgias, back pain, joint swelling, neck pain and neck stiffness.  Skin:  Negative for color change, rash and wound.  Neurological:  Negative for dizziness, tremors, seizures, syncope, facial asymmetry, speech difficulty, weakness, light-headedness, numbness and headaches.  Hematological:  Does not bruise/bleed easily.  Psychiatric/Behavioral:  Negative for agitation, confusion and sleep disturbance.        Objective:   Physical Exam Vitals and nursing note reviewed.  Constitutional:      General: She is awake. She is not in acute distress.    Appearance: Normal appearance. She is well-developed, well-groomed and overweight. She is not ill-appearing, toxic-appearing or diaphoretic.  HENT:     Head: Normocephalic and atraumatic.     Jaw: There is normal jaw occlusion.     Salivary Glands: Right salivary gland is not diffusely enlarged. Left salivary gland is not diffusely enlarged.     Right Ear: Hearing and external ear normal.     Left Ear: Hearing and external ear normal.     Nose: Nose normal. No  congestion or rhinorrhea.     Mouth/Throat:     Lips: Pink. No lesions.     Mouth: Mucous membranes are moist. No oral lesions or angioedema.     Dentition: No gum lesions.     Pharynx: Oropharynx is clear.  Eyes:     General: Lids are normal. Vision grossly intact. Gaze aligned appropriately. Allergic shiner present. No scleral icterus.       Right eye: No discharge.        Left eye: No discharge.     Extraocular Movements: Extraocular movements intact.     Conjunctiva/sclera: Conjunctivae normal.     Pupils: Pupils are equal, round, and reactive to light.  Neck:     Trachea: Trachea and phonation normal. No tracheal deviation.  Cardiovascular:     Rate and Rhythm: Normal rate and regular rhythm.     Pulses:          Dorsalis pedis pulses are 2+ on the left side.  Pulmonary:     Effort: Pulmonary effort is normal.     Breath sounds: Normal breath sounds and air entry. No stridor or transmitted upper airway sounds. No wheezing.     Comments: Spoke full sentences without difficulty; no cough observed in exam room Abdominal:     General: Abdomen is flat.  Musculoskeletal:        General: Tenderness and signs of injury present. No swelling or deformity. Normal range of motion.     Right hand: Normal strength. Normal capillary refill.  Left hand: Normal strength. Normal capillary refill.     Cervical back: Normal range of motion and neck supple. No edema, signs of trauma, rigidity, torticollis or crepitus.     Right lower leg: Tenderness present. No swelling, deformity, lacerations or bony tenderness. No edema.     Left lower leg: Tenderness present. No swelling, deformity, lacerations or bony tenderness. No edema.     Right ankle: No swelling, deformity, ecchymosis or lacerations. No tenderness. Normal range of motion.     Left ankle: No swelling, deformity, ecchymosis or lacerations. No tenderness. Normal range of motion.       Legs:     Comments: Both lower legs/gastrocnemius  and achilles tendons TTP Left greater than right; diameter right 15.75" left 15.25"; no defect/step off gait normal no limping observed in clinic/hallway  Lymphadenopathy:     Head:     Right side of head: No submandibular or preauricular adenopathy.     Left side of head: No submandibular or preauricular adenopathy.     Cervical:     Right cervical: No superficial cervical adenopathy.    Left cervical: No superficial cervical adenopathy.  Skin:    General: Skin is warm and dry.     Capillary Refill: Capillary refill takes less than 2 seconds.     Coloration: Skin is not ashen, cyanotic, jaundiced, mottled, pale or sallow.     Findings: No abrasion, abscess, acne, bruising, burn, ecchymosis, erythema, signs of injury, laceration, lesion, petechiae, rash or wound.     Nails: There is no clubbing.  Neurological:     General: No focal deficit present.     Mental Status: She is alert and oriented to person, place, and time. Mental status is at baseline.     GCS: GCS eye subscore is 4. GCS verbal subscore is 5. GCS motor subscore is 6.     Cranial Nerves: Cranial nerves 2-12 are intact. No cranial nerve deficit, dysarthria or facial asymmetry.     Sensory: Sensation is intact.     Motor: Motor function is intact. No weakness, tremor, atrophy, abnormal muscle tone or seizure activity.     Coordination: Coordination is intact. Coordination normal.     Gait: Gait is intact. Gait normal.     Comments: In/out of chair without difficulty; gait sure and steady in clinic; bilateral hand grasp equal 5/5  Psychiatric:        Attention and Perception: Attention and perception normal.        Mood and Affect: Mood and affect normal.        Speech: Speech normal.        Behavior: Behavior normal. Behavior is cooperative.        Thought Content: Thought content normal.        Cognition and Memory: Cognition and memory normal.        Judgment: Judgment normal.           Assessment & Plan:    A-left gastrocnemius strain, initial visit  P-discussed knee high compression socks for 30 days.  Gentle AROM.  Avoid high impact activities eg jumping/running/kicks.  Trial thermacare patch at work as muscles tight.  May continue aleve 220mg  po BID prn pain take with food.  May increase to 2 tabs if no relief with one tab or add tylenol 1000mg  po q6h prn pain.  Cryotherapy 15 minutes topical QID prn pain/swelling.  Notify me if bruise or deformity develops/worsening pain/new swelling.  Re-evaluation 2 weeks sooner if  worsening.  Discussed gentle arom/massage/epsom salt bath soak.  If work restrictions needed will need to see PCM/WCC.  Discussed these injuries typically take longer to heal as muscle group getting used during walking only resting during sleep.  Discussed rest during holiday.  She stated currently not running still due to right calf injury a year ago.  Discussed not to be over agressive with stretching.  Exitcare handouts gastroc strain and rehab exercises.  Patient verbalized understanding information/instructions, agreed with plan of care and had no further questions at this time.

## 2022-01-09 NOTE — Patient Instructions (Signed)
Medial Head Gastrocnemius Tear Rehab Ask your health care provider which exercises are safe for you. Do exercises exactly as told by your health care provider and adjust them as directed. It is normal to feel mild stretching, pulling, tightness, or discomfort as you do these exercises. Stop right away if you feel sudden pain or your pain gets worse. Do not begin these exercises until told by your health care provider. Stretching and range-of-motion exercises These exercises warm up your muscles and joints and improve the movement and flexibility of your lower leg. These exercises also help to relieve pain and stiffness. Gastrocnemius stretch This exercise is also called a calf stretch. It stretches the muscles in the back of the lower leg (gastrocnemius). Sit with your left / right leg extended. Loop a belt or towel around the ball of your left / right foot. The ball of your foot is on the walking surface, right under your toes. Hold both ends of the belt or towel. Keep your left / right ankle and foot relaxed and keep your knee straight while you use the belt or towel to pull your foot and ankle toward you. Stop at the first point of resistance. Hold this position for _____5-15_____ seconds. Repeat ____3______ times. Complete this exercise _____2_____ times a day. Ankle alphabet  Sit with your left / right leg supported at the lower leg. Do not rest your foot on anything. Make sure your foot has room to move freely. Think of your left / right foot as a paintbrush. Move your foot to trace each letter of the alphabet in the air. Keep your hip and knee still while you trace. Make the letters as large as you can without feeling discomfort. Trace every letter of the alphabet. Repeat ____3______ times. Complete this exercise _____2_____ times a day. Strengthening exercises These exercises build strength and endurance in your lower leg. Endurance is the ability to use your muscles for a long time,  even after they get tired. Plantar flexion with band, seated  Sit on the floor with your left / right leg extended. Loop a rubber exercise band or tube around the ball of your left / right foot. The ball of your foot is on the walking surface, right under your toes. The band or tube should be slightly tense when your foot is relaxed. If the band or tube slips, you can put on your shoe or put a washcloth between the band and your foot to help it stay in place. While holding both ends of the band or tube, slowly point your toes downward, pushing them away from you (plantar flexion). Hold this position for ____5-15______ seconds. Slowly release the tension in the band or tube, controlling smoothly until your foot is back to the starting position. Repeat _____3_____ times. Complete this exercise ____2______ times a day. Plantar flexion, standing  Stand with your feet shoulder-width apart. Place your hands on a wall or table to steady yourself as needed, but try not to use it for support. Rise up on your toes (plantar flexion). If this exercise is too easy, try these options: Shift your weight toward your left / right leg until you feel challenged. If told by your health care provider, stand on your left / right foot only. Hold this position for ___5-15_______ seconds. Repeat _____3_____ times. Complete this exercise ______2____ times a day. Eccentric plantar flexion  Stand on the balls of your feet on the edge of a step. The ball of your foot is  on the walking surface, right under your toes. Do not put your heels on the step. For balance, rest your hands on the wall or on a railing. Try not to lean on it for support. Rise up onto the balls of your feet, using both legs to help. Keeping your heels up, slowly shift all of your weight to your left / right foot and lift your other foot off the step. Slowly lower your left / right heel so it drops below the level of the step. Lowering your heel  under tension is called eccentric plantar flexion. You will feel a slight stretch in your left / right calf. Put your other foot back onto the step before returning to the start position. Repeat ___3_______ times. Complete this exercise ______2____ times a day. This information is not intended to replace advice given to you by your health care provider. Make sure you discuss any questions you have with your health care provider. Document Revised: 07/03/2020 Document Reviewed: 07/03/2020 [employer] Patient Education  2023 [employer] Medial Head Gastrocnemius Tear  Medial head gastrocnemius tear, also called tennis leg, is an injury to the inner (medial) part of the calf muscle. This injury may include overstretching of the muscle or a partial or complete tear. This is a common sports injury. Calf muscle tears usually occur near the back of the knee. This often causes sudden pain and muscle weakness. What are the causes? This condition is caused by forceful stretching or strain on the calf muscle. This usually happens when you forcefully push off of your foot. It may also happen if you forcefully straighten your knee while your foot is flat on the ground. What increases the risk? The following factors may make you more likely to develop this condition: Being female and older than age 50. Playing sports that involve: Quick increases in speed and changes of direction, such as in tennis and soccer. Jumping. Running, especially uphill or on uneven ground. What are the signs or symptoms? Symptoms of this condition include: Sudden pain in the back of the leg. You may hear a noise, like a pop or a snap at the time of injury. Pain that gets worse when you bring your toes up or when you straighten your knee. Pain on the inside of your calf, from your knee to your ankle. Pain when pressing on your calf muscle. Swelling and bruising along your calf and lower leg, down to your ankle. This may worsen for  the first 2 days before getting better. Not being able to rise up on your toes, or pain when doing so. Difficulty pushing off your foot when walking or using stairs. How is this diagnosed? This condition may be diagnosed based on: Your symptoms and medical history. A physical exam. Your health care provider may be able to feel a lump or a defect in your muscle. An MRI or ultrasound to determine the severity and exact location of your injury. How is this treated? Treatment for this condition may include: Resting the muscle and keeping weight off your leg for several days. During this time, you may use crutches or another walking device. Using a splint to keep your ankle or knee in a stable position. Wearing a walking boot to decrease the use of your gastrocnemius muscle. Using a wedge under your heel to reduce stretching of your healing muscle. Wearing a compression sleeve around your calf muscle. Taking medicine for pain and swelling, such as NSAIDs or steroids. Taking medicine for  muscle spasms. Follow these instructions at home: Medicines Take over-the-counter and prescription medicines only as told by your health care provider. Ask your health care provider if the medicine prescribed to you requires you to avoid driving or using heavy machinery. Talk with your health care provider before you take any medicines that contain aspirin. Aspirin increases your risk for bleeding at the injured area. If you have a removable splint, boot, or compression sleeve: Wear it as told by your health care provider. Remove it only as told by your health care provider. Check the skin around it every day. Tell your health care provider about any concerns. Loosen the splint, boot, or sleeve if your toes tingle, become numb, or turn cold and blue. Keep the splint, boot, or sleeve clean and dry. If the splint, boot, or sleeve is not waterproof: Do not let it get wet. Cover it with a watertight covering when  you take a bath or shower. Managing pain, stiffness, and swelling  If directed, put ice on the injured area. If you have a removable splint, boot, or sleeve, remove it as told by your health care provider. Put ice in a plastic bag. Place a towel between your skin and the bag. Leave the ice on for 20 minutes, 2-3 times a day. Remove the ice if your skin turns bright red. This is very important. If you cannot feel pain, heat, or cold, you have a greater risk of damage to the area. Move your toes often to reduce stiffness and swelling. Elevate the injured area above the level of your heart while you are sitting or lying down. Activity Return to your normal activities as told by your health care provider. Ask your health care provider what activities are safe for you. Do not use the injured limb to support your body weight until your health care provider says that you can. Use crutches as told by your health care provider. Do leg exercises as told by your health care provider or physical therapist. Return to sporting activity gradually and only as told by your health care provider or physical therapist. Full recovery may take several months. General instructions Ask your health care provider when it is safe to drive. Do not use any products that contain nicotine or tobacco. These products include cigarettes, chewing tobacco, and vaping devices, such as e-cigarettes. If you need help quitting, ask your health care provider. Keep all follow-up visits. This is important. How is this prevented? Warm up and stretch before being active. Cool down and stretch after being active. Give your body time to rest between periods of activity. Maintain physical fitness, including: Strength. Flexibility. Contact a health care provider if: Your symptoms do not improve with rest and treatment. Get help right away if: You have swelling or redness in your calf that is getting worse. Your skin or toenails turn  blue or gray, feel cold, or become numb. Summary Medial head gastrocnemius tear, also called tennis leg, is an injury to the inner part of the calf muscle. Follow instructions as told by your health care provider for resting, icing, compressing, and elevating your leg. Take over-the-counter and prescription medicines only as told by your health care provider. Contact a health care provider if your symptoms do not improve with rest and treatment. This information is not intended to replace advice given to you by your health care provider. Make sure you discuss any questions you have with your health care provider. Document Revised: 06/07/2020 Document Reviewed: 06/07/2020  [employer] Patient Education  2023 [employer]  

## 2022-04-01 ENCOUNTER — Telehealth: Payer: Self-pay | Admitting: Registered Nurse

## 2022-04-01 ENCOUNTER — Encounter: Payer: Self-pay | Admitting: Registered Nurse

## 2022-04-01 DIAGNOSIS — J019 Acute sinusitis, unspecified: Secondary | ICD-10-CM

## 2022-04-01 MED ORDER — AMOXICILLIN 875 MG PO TABS: 875.0000 mg | ORAL_TABLET | Freq: Two times a day (BID) | ORAL | 0 refills | Status: AC

## 2022-04-01 NOTE — Telephone Encounter (Signed)
Patient contacted NP has had low grade fever after sinus pain/pressure developed this weekend.  "I typically get a spring sinus infection"  Last PDRx fill augmentin or amoxicillin for spring sinus infection was 2019/2018.  Typically more fall sinus infections on file.  Home covid tests x2 negative repeating every 48 hours x 3.  Stated only body aches she has is when chills/fever occurring then resolves.  This doesn't feel like the flu.  Electronic Rx sent to her pharmacy of choice amoxicillin '875mg'$  po BID x 10 days #20 RF0.  Increase nasal saline frequency/use.  Hydrate with water.  Continue allegra '180mg'$  po daily.  Patient verbalized understanding information/instructions, agreed with plan of care and had no further questions at that time.  A&Ox3 spoke full sentences without difficulty, duration of call 5 minutes.  Nasal congestion auditory.  No audible cough/throat clearing/wheezing during call.

## 2022-04-02 NOTE — Telephone Encounter (Signed)
Patient feeling much better like night and day different. Patient has not had fever in 24 hrs. RTW 3/14. Supervisor and HR made aware.

## 2022-04-03 NOTE — Telephone Encounter (Signed)
Patient returned to work today doing much better just little stuffy. Educated if needs anything to contact the clinic.

## 2022-04-11 ENCOUNTER — Encounter: Payer: Self-pay | Admitting: Registered Nurse

## 2022-04-11 ENCOUNTER — Telehealth: Payer: Self-pay | Admitting: Registered Nurse

## 2022-04-11 DIAGNOSIS — Z0000 Encounter for general adult medical examination without abnormal findings: Secondary | ICD-10-CM

## 2022-04-11 NOTE — Telephone Encounter (Signed)
Epic reviewed patient due [facility] labs April fasting Vitamin D, Hgba1c and female executive panel ordered for patient please schedule.

## 2022-04-16 ENCOUNTER — Telehealth: Payer: Self-pay | Admitting: Registered Nurse

## 2022-04-16 DIAGNOSIS — R051 Acute cough: Secondary | ICD-10-CM

## 2022-04-16 MED ORDER — PROMETHAZINE HCL 6.25 MG/5ML PO SYRP: 6.2500 mg | ORAL_SOLUTION | Freq: Every evening | ORAL | 0 refills | Status: DC | PRN

## 2022-04-17 ENCOUNTER — Encounter: Payer: Self-pay | Admitting: Registered Nurse

## 2022-04-17 NOTE — Telephone Encounter (Signed)
Patient reported hours of sleep cough worsening with allergies and the only thing that works is promethazine syrup.  Her bottle at home one year old and tasting bad.  She would like new Rx sent to her pharmacy.  Electronic Rx promethazine 6.25mg /64ml po qhs prn cough #120 RF0 sent to her pharmacy of choice.  Continue nasal saline, allegra and consider restarting flonase.  Shower prior to bedtime.  Do not sleep with windows open consider am shower again if very congested.  Consider honey 1 tablespoon prior to bedtime po also.  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

## 2022-05-07 ENCOUNTER — Ambulatory Visit: Payer: Self-pay | Admitting: Occupational Medicine

## 2022-05-07 DIAGNOSIS — H6593 Unspecified nonsuppurative otitis media, bilateral: Secondary | ICD-10-CM

## 2022-05-07 NOTE — Progress Notes (Signed)
Pt reports right ear muffle sound. Started Friday was taking allergy meds and sudafed. Forgot dose yesterday. Pt reports its worse today and wanted to make sure no infection. Exam ears large amount of fluid to right ear. Only slight to left ear. Recent sinus infection 2 weeks ago. Scheduled appt with NP in am.

## 2022-05-08 ENCOUNTER — Ambulatory Visit: Payer: Self-pay | Admitting: Registered Nurse

## 2022-05-08 ENCOUNTER — Encounter: Payer: Self-pay | Admitting: Registered Nurse

## 2022-05-08 VITALS — BP 140/90 | HR 97 | Temp 98.3°F | Resp 20

## 2022-05-08 DIAGNOSIS — H6993 Unspecified Eustachian tube disorder, bilateral: Secondary | ICD-10-CM

## 2022-05-08 DIAGNOSIS — J301 Allergic rhinitis due to pollen: Secondary | ICD-10-CM

## 2022-05-08 MED ORDER — PHENYLEPHRINE HCL 10 MG PO TABS
10.0000 mg | ORAL_TABLET | Freq: Four times a day (QID) | ORAL | Status: AC | PRN
Start: 2022-05-08 — End: 2022-05-15

## 2022-05-08 MED ORDER — SALINE SPRAY 0.65 % NA SOLN: 2.0000 | NASAL | 0 refills | Status: DC

## 2022-05-08 MED ORDER — PSEUDOEPHEDRINE HCL 30 MG PO TABA: 1.0000 | ORAL_TABLET | ORAL | 0 refills | Status: DC | PRN

## 2022-05-08 NOTE — Patient Instructions (Addendum)
How to Perform a Sinus Rinse A sinus rinse is a home treatment that is used to rinse your sinuses with a germ-free (sterile) mixture of salt and water (saline solution). Sinuses are air-filled spaces in your skull that are behind the bones of your face and forehead. They open into your nasal cavity. A sinus rinse can help to clear mucus, dirt, dust, or pollen from your nasal cavity. You may do a sinus rinse when you have a cold, a virus, nasal allergy symptoms, a sinus infection, or stuffiness in your nose or sinuses. What are the risks? A sinus rinse is generally safe and effective. However, there are a few risks, which include: A burning sensation in your sinuses. This may happen if you do not make the saline solution as directed. Be sure to follow all directions when making the saline solution. Nasal irritation. Infection. This may be from unclean supplies or from contaminated water. Infection from contaminated water is rare, but possible. Do not do a sinus rinse if you have had ear or nasal surgery, ear infection, or plugged ears, unless recommended by your health care provider. Supplies needed: Saline solution or powder. Distilled or sterile water to mix with saline powder. You may use boiled and cooled tap water. Boil tap water for 5 minutes; cool until it is lukewarm. Use within 24 hours. Do not use regular tap water to mix with the saline solution. Neti pot or nasal rinse bottle. These supplies release the saline solution into your nose and through your sinuses. Neti pots and nasal rinse bottles can be purchased at your local pharmacy, a health food store, or online. How to perform a sinus rinse  Wash your hands with soap and water for at least 20 seconds. If soap and water are not available, use hand sanitizer. Wash your device according to the directions that came with the product and then dry it. Use the solution that comes with your product or one that is sold separately in stores.  Follow the mixing directions on the package to mix with sterile or distilled water. Fill the device with the amount of saline solution noted in the device instructions. Stand by a sink and tilt your head sideways over the sink. Place the spout of the device in your upper nostril (the one closer to the ceiling). Gently pour or squeeze the saline solution into your nasal cavity. The liquid should drain out from the lower nostril if you are not too congested. While rinsing, breathe through your open mouth. Gently blow your nose to clear any mucus and rinse solution. Blowing too hard may cause ear pain. Turn your head in the other direction and repeat in your other nostril. Clean and rinse your device with clean water and then air-dry it. Talk with your health care provider or pharmacist if you have questions about how to do a sinus rinse. Summary A sinus rinse is a home treatment that is used to rinse your sinuses with a sterile mixture of salt and water (saline solution). You may do a sinus rinse when you have a cold, a virus, nasal allergy symptoms, a sinus infection, or stuffiness in your nose or sinuses. A sinus rinse is generally safe and effective. Follow all instructions carefully. This information is not intended to replace advice given to you by your health care provider. Make sure you discuss any questions you have with your health care provider. Document Revised: 06/25/2020 Document Reviewed: 06/25/2020 Elsevier Patient Education  2023 [employer]    Allergic Rhinitis, Adult  Allergic rhinitis is an allergic reaction that affects the mucous membrane inside the nose. The mucous membrane is the tissue that produces mucus. There are two types of allergic rhinitis: Seasonal. This type is also called hay fever and happens only during certain seasons. Perennial. This type can happen at any time of the year. Allergic rhinitis cannot be spread from person to person. This condition can be  mild, bad, or very bad. It can develop at any age and may be outgrown. What are the causes? This condition is caused by allergens. These are things that can cause an allergic reaction. Allergens may differ for seasonal allergic rhinitis and perennial allergic rhinitis. Seasonal allergic rhinitis is caused by pollen. Pollen can come from grasses, trees, and weeds. Perennial allergic rhinitis may be caused by: Dust mites. Proteins in a pet's pee (urine), saliva, or dander. Dander is dead skin cells from a pet. Smoke, mold, or car fumes. Remains of or waste from insects such as cockroaches. What increases the risk? You are more likely to develop this condition if you have a family history of allergies or other conditions related to allergies, including: Allergic conjunctivitis. This is irritation and swelling of parts of the eyes and eyelids. Asthma. This condition affects the lungs and makes it hard to breathe. Atopic dermatitis or eczema. This is long term (chronic) irritation and swelling of the skin. Food allergies. What are the signs or symptoms? Symptoms of this condition include: Sneezing or coughing. A stuffy nose (nasal congestion), itchy nose, or nasal discharge. Itchy eyes and tearing of the eyes. A feeling of mucus dripping down the back of your throat (postnasal drip). This may cause a sore throat. Trouble sleeping. Tiredness. Headache. How is this diagnosed? This condition may be diagnosed with your symptoms, your medical history, and a physical exam. Your health care provider may check for related conditions, such as: Asthma. Pink eye. This is eye swelling and irritation caused by infection (conjunctivitis). Ear infection. Upper respiratory infection. This is an infection in the nose, throat, or upper airways. You may also have tests to find out which allergens cause your symptoms. These may include skin tests or blood tests. How is this treated? There is no cure for this  condition, but treatment can help control symptoms. Treatment may include: Taking medicines that block allergy symptoms, such as corticosteroids (anti-inflammatories) and antihistamines. Medicine may be given as a shot, nasal spray, or pill. Avoiding any allergens. Being exposed again and again to tiny amounts of allergens to help you build a defense against allergens (allergenimmunotherapy). This is done if other treatments have not helped. It may include: Allergy shots. These are injected medicines that have small amounts of an allergen in them. Sublingual immunotherapy. This involves taking small doses of a medicine with an allergen in it under your tongue. If these treatments do not work, your provider may prescribe newer, stronger medicines. Follow these instructions at home: Avoiding allergens Find out what you are allergic to and avoid those allergens. These are some things you can do to help avoid allergens: If you have perennial allergies: Replace carpet with wood, tile, or vinyl flooring. Carpet can trap dander and dust. Do not smoke. Do not allow smoking in your home Change your heating and air conditioning filters at least once a month. If you have seasonal allergies, take these steps during allergy season: Keep windows closed as much as possible. Plan outdoor activities when pollen counts are lowest. Check pollen counts  before you plan outdoor activities When coming indoors, change clothing and shower before sitting on furniture or bedding. If you have a pet in the house that produces allergens: Keep the pet out of the bedroom. Vacuum, sweep, and dust regularly. General instructions Take over-the-counter and prescription medicines only as told by your provider. Drink enough fluid to keep your pee pale yellow. Where to find more information American [employer] Immunology: aaaai.org Contact a health care provider if: You have a fever. You develop a cough that  does not go away. You make high-pitched whistling sounds when you breathe, most often when you breathe out (wheeze). Your symptoms slow you down or stop you from doing your normal activities each day. Get help right away if: You have shortness of breath. This symptom may be an emergency. Get help right away. Call 911. Do not wait to see if the symptoms will go away. Do not drive yourself to the hospital. This information is not intended to replace advice given to you by your health care provider. Make sure you discuss any questions you have with your health care provider. Document Revised: 09/16/2021 Document Reviewed: 09/16/2021 Elsevier Patient Education  2023 [employer] Eustachian Tube Dysfunction  Eustachian tube dysfunction refers to a condition in which a blockage develops in the narrow passage that connects the middle ear to the back of the nose (eustachian tube). The eustachian tube regulates air pressure in the middle ear by letting air move between the ear and nose. It also helps to drain fluid from the middle ear space. Eustachian tube dysfunction can affect one or both ears. When the eustachian tube does not function properly, air pressure, fluid, or both can build up in the middle ear. What are the causes? This condition occurs when the eustachian tube becomes blocked or cannot open normally. Common causes of this condition include: Ear infections. Colds and other infections that affect the nose, mouth, and throat (upper respiratory tract). Allergies. Irritation from cigarette smoke. Irritation from stomach acid coming up into the esophagus (gastroesophageal reflux). The esophagus is the part of the body that moves food from the mouth to the stomach. Sudden changes in air pressure, such as from descending in an airplane or scuba diving. Abnormal growths in the nose or throat, such as: Growths that line the nose (nasal polyps). Abnormal growth of cells (tumors). Enlarged  tissue at the back of the throat (adenoids). What increases the risk? You are more likely to develop this condition if: You smoke. You are overweight. You are a child who has: Certain birth defects of the mouth, such as cleft palate. Large tonsils or adenoids. What are the signs or symptoms? Common symptoms of this condition include: A feeling of fullness in the ear. Ear pain. Clicking or popping noises in the ear. Ringing in the ear (tinnitus). Hearing loss. Loss of balance. Dizziness. Symptoms may get worse when the air pressure around you changes, such as when you travel to an area of high elevation, fly on an airplane, or go scuba diving. How is this diagnosed? This condition may be diagnosed based on: Your symptoms. A physical exam of your ears, nose, and throat. Tests, such as those that measure: The movement of your eardrum. Your hearing (audiometry). How is this treated? Treatment depends on the cause and severity of your condition. In mild cases, you may relieve your symptoms by moving air into your ears. This is called "popping the ears." In more severe cases, or  if you have symptoms of fluid in your ears, treatment may include: Medicines to relieve congestion (decongestants). Medicines that treat allergies (antihistamines). Nasal sprays or ear drops that contain medicines that reduce swelling (steroids). A procedure to drain the fluid in your eardrum. In this procedure, a small tube may be placed in the eardrum to: Drain the fluid. Restore the air in the middle ear space. A procedure to insert a balloon device through the nose to inflate the opening of the eustachian tube (balloon dilation). Follow these instructions at home: Lifestyle Do not do any of the following until your health care provider approves: Travel to high altitudes. Fly in airplanes. Work in a Estate agent or room. Scuba dive. Do not use any products that contain nicotine or tobacco. These  products include cigarettes, chewing tobacco, and vaping devices, such as e-cigarettes. If you need help quitting, ask your health care provider. Keep your ears dry. Wear fitted earplugs during showering and bathing. Dry your ears completely after. General instructions Take over-the-counter and prescription medicines only as told by your health care provider. Use techniques to help pop your ears as recommended by your health care provider. These may include: Chewing gum. Yawning. Frequent, forceful swallowing. Closing your mouth, holding your nose closed, and gently blowing as if you are trying to blow air out of your nose. Keep all follow-up visits. This is important. Contact a health care provider if: Your symptoms do not go away after treatment. Your symptoms come back after treatment. You are unable to pop your ears. You have: A fever. Pain in your ear. Pain in your head or neck. Fluid draining from your ear. Your hearing suddenly changes. You become very dizzy. You lose your balance. Get help right away if: You have a sudden, severe increase in any of your symptoms. Summary Eustachian tube dysfunction refers to a condition in which a blockage develops in the eustachian tube. It can be caused by ear infections, allergies, inhaled irritants, or abnormal growths in the nose or throat. Symptoms may include ear pain or fullness, hearing loss, or ringing in the ears. Mild cases are treated with techniques to unblock the ears, such as yawning or chewing gum. More severe cases are treated with medicines or procedures. This information is not intended to replace advice given to you by your health care provider. Make sure you discuss any questions you have with your health care provider. Document Revised: 03/19/2020 Document Reviewed: 03/19/2020 Elsevier Patient Education  2023 [employer].

## 2022-05-08 NOTE — Progress Notes (Signed)
Subjective:    Patient ID: Madison Garner, female    DOB: 1970/02/10, 51 y.o.   MRN: [id number]  51y/o caucasian female established patient here for ear check due to pain, decreased/muffled hearing.  Has been taking her seasonal allergy medication ran out of sudafed planning to pick up more after work today.  Would like some phenylephrine from clinic stock as it does help.  Using nasal saline twice a day.  Denied fever/chills/headache/ear discharge.  Has some nasal congestion/post nasal drip clear.  Last round of antibiotics cleared up sinus infection  Wearing new Brooks Ghost 15 shoes and seems to be helping feet/legs.  Starting walking again to help with weight loss/health.      Review of Systems  Constitutional:  Negative for activity change, appetite change, chills, diaphoresis, fatigue, fever and unexpected weight change.  HENT:  Positive for congestion, ear pain, hearing loss, postnasal drip, rhinorrhea and sinus pressure. Negative for dental problem, drooling, ear discharge, facial swelling, mouth sores, nosebleeds, sneezing, sore throat, tinnitus, trouble swallowing and voice change.   Eyes:  Negative for photophobia, pain, discharge, redness, itching and visual disturbance.  Respiratory:  Negative for cough, choking, chest tightness, shortness of breath, wheezing and stridor.   Cardiovascular:  Negative for chest pain, palpitations and leg swelling.  Gastrointestinal:  Negative for diarrhea, nausea and vomiting.  Endocrine: Negative for cold intolerance and heat intolerance.  Genitourinary:  Negative for difficulty urinating.  Musculoskeletal:  Negative for back pain, gait problem, joint swelling, myalgias, neck pain and neck stiffness.  Skin:  Negative for color change, pallor, rash and wound.  Allergic/Immunologic: Positive for environmental allergies. Negative for food allergies.  Neurological:  Negative for dizziness, tremors, seizures, syncope, facial asymmetry, speech  difficulty, weakness, light-headedness, numbness and headaches.  Hematological:  Negative for adenopathy. Does not bruise/bleed easily.  Psychiatric/Behavioral:  Negative for agitation, behavioral problems, confusion and sleep disturbance.        Objective:   Physical Exam Vitals and nursing note reviewed.  Constitutional:      General: She is awake. She is not in acute distress.    Appearance: Normal appearance. She is well-developed, well-groomed and overweight. She is not ill-appearing, toxic-appearing or diaphoretic.  HENT:     Head: Normocephalic and atraumatic.     Jaw: There is normal jaw occlusion. No trismus.     Salivary Glands: Right salivary gland is not diffusely enlarged or tender. Left salivary gland is not diffusely enlarged or tender.     Right Ear: Hearing, ear canal and external ear normal. No decreased hearing noted. No laceration, drainage, swelling or tenderness. A middle ear effusion is present. There is no impacted cerumen. No foreign body. No mastoid tenderness. No PE tube. No hemotympanum. Tympanic membrane is not injected, scarred, perforated, erythematous, retracted or bulging.     Left Ear: Hearing, ear canal and external ear normal. No decreased hearing noted. No laceration, drainage, swelling or tenderness. A middle ear effusion is present. There is no impacted cerumen. No foreign body. No mastoid tenderness. No PE tube. No hemotympanum. Tympanic membrane is not injected, scarred, perforated, erythematous, retracted or bulging.     Nose: Mucosal edema, congestion and rhinorrhea present. No nasal deformity, septal deviation or laceration. Rhinorrhea is clear.     Right Turbinates: Enlarged and swollen. Not pale.     Left Turbinates: Enlarged and swollen. Not pale.     Right Sinus: No maxillary sinus tenderness or frontal sinus tenderness.     Left Sinus: No  maxillary sinus tenderness or frontal sinus tenderness.     Mouth/Throat:     Lips: Pink. No lesions.      Mouth: Mucous membranes are moist. Mucous membranes are not pale, not dry and not cyanotic. No lacerations, oral lesions or angioedema.     Dentition: No dental abscesses or gum lesions.     Tongue: No lesions. Tongue does not deviate from midline.     Palate: No mass and lesions.     Pharynx: Uvula midline. Pharyngeal swelling and posterior oropharyngeal erythema present. No oropharyngeal exudate or uvula swelling.     Tonsils: No tonsillar exudate or tonsillar abscesses.     Comments: Bilateral allergic shiners; bilateral TMs intact air fluid level clear; nasal turbinates edema erythema clear discharge Eyes:     General: Lids are normal. Vision grossly intact. Gaze aligned appropriately. Allergic shiner present. No scleral icterus.       Right eye: No foreign body, discharge or hordeolum.        Left eye: No foreign body, discharge or hordeolum.     Extraocular Movements: Extraocular movements intact.     Right eye: Normal extraocular motion and no nystagmus.     Left eye: Normal extraocular motion and no nystagmus.     Conjunctiva/sclera: Conjunctivae normal.     Right eye: Right conjunctiva is not injected. No chemosis, exudate or hemorrhage.    Left eye: Left conjunctiva is not injected. No chemosis, exudate or hemorrhage.    Pupils: Pupils are equal, round, and reactive to light. Pupils are equal.     Right eye: Pupil is round and reactive.     Left eye: Pupil is round and reactive.  Neck:     Thyroid: No thyroid mass or thyromegaly.     Trachea: Trachea and phonation normal. No tracheal tenderness or tracheal deviation.  Cardiovascular:     Rate and Rhythm: Normal rate and regular rhythm.     Pulses:          Radial pulses are 2+ on the right side and 2+ on the left side.     Heart sounds: Normal heart sounds, S1 normal and S2 normal.  Pulmonary:     Effort: Pulmonary effort is normal. No accessory muscle usage or respiratory distress.     Breath sounds: Normal breath sounds and  air entry. No stridor. No decreased breath sounds, wheezing, rhonchi or rales.     Comments: No cough observed in exam room spoke full sentences without difficulty respirations even and unlabored Chest:     Chest wall: No tenderness.  Abdominal:     General: There is no distension.     Palpations: Abdomen is soft.  Musculoskeletal:        General: No tenderness. Normal range of motion.     Right hand: Normal strength. Normal capillary refill.     Left hand: Normal strength. Normal capillary refill.     Cervical back: Normal range of motion and neck supple. No swelling, edema, deformity, erythema, signs of trauma, lacerations, rigidity, torticollis, tenderness or crepitus. No pain with movement. Normal range of motion.     Thoracic back: No swelling, edema, deformity, signs of trauma, lacerations, spasms or tenderness. Normal range of motion.     Right lower leg: No deformity.     Left lower leg: No deformity.  Lymphadenopathy:     Head:     Right side of head: No submental, submandibular, tonsillar, preauricular, posterior auricular or occipital adenopathy.  Left side of head: No submental, submandibular, tonsillar, preauricular, posterior auricular or occipital adenopathy.     Cervical: No cervical adenopathy.     Right cervical: No superficial, deep or posterior cervical adenopathy.    Left cervical: No superficial, deep or posterior cervical adenopathy.  Skin:    General: Skin is warm and dry.     Capillary Refill: Capillary refill takes less than 2 seconds.     Coloration: Skin is not ashen, cyanotic, jaundiced, mottled, pale or sallow.     Findings: No abrasion, abscess, acne, bruising, burn, ecchymosis, erythema, signs of injury, laceration, lesion, petechiae, rash or wound.     Nails: There is no clubbing.  Neurological:     Mental Status: She is alert and oriented to person, place, and time. She is not disoriented.     GCS: GCS eye subscore is 4. GCS verbal subscore is 5. GCS  motor subscore is 6.     Cranial Nerves: Cranial nerves 2-12 are intact. No cranial nerve deficit, dysarthria or facial asymmetry.     Sensory: No sensory deficit.     Motor: Motor function is intact. No weakness, tremor, atrophy, abnormal muscle tone or seizure activity.     Coordination: Coordination is intact. Coordination normal.     Gait: Gait is intact. Gait normal.     Comments: In/out of chair and on/off exam table without difficulty; gait sure and steady in clinic; bilateral hand grasp equal 5/5  Psychiatric:        Attention and Perception: Attention and perception normal.        Mood and Affect: Mood and affect normal.        Speech: Speech normal.        Behavior: Behavior normal. Behavior is cooperative.        Thought Content: Thought content normal.        Cognition and Memory: Cognition and memory normal.        Judgment: Judgment normal.           Assessment & Plan:   A-eustachian tube dysfunction bilateral, seasonal allergic rhinitis due to pollen  P- No evidence of invasive bacterial infection, non toxic and well hydrated.  I do not see where any further testing or imaging is necessary at this time.   I will suggest supportive care, rest, good hygiene and encourage the patient to take adequate fluids.  The patient is to return to clinic or EMERGENCY ROOM if symptoms worsen or change significantly e.g. ear pain, fever, purulent discharge from ears or bleeding.  Exitcare handout on eustachian tube dysfunction.  Discussed with patient post nasal drip irritates throat/causes swelling blocks eustachian tubes from draining and fluid fills up middle ear.  Bacteria/viruses can grow in fluid and with moving head tube compressed and increases pressure in tube/ear worsening pain.  Studies show will take 30 days for fluid to resolve after post nasal drip controlled with nasal steroid/antihistamine. Antibiotics and steroids do not speed up fluid removal.  Patient verbalized agreement  and understanding of treatment plan and had no further questions at this time.   Patient may use normal saline nasal spray 2 sprays each nostril q2h wa as needed.  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   flonase 1 spray each nostril BID OTC.  Patient denied personal or family history of ENT cancer.  OTC antihistamine of choice allergra 180mg  po daily.  Patient may choose either pseudoephredrine  po q4h prn congestion/rhitniis OTC max  in 24 hours or phenylephrine  po q6h prn rhintiis.  Given 4 UD from clinic stock.  Avoid triggers if possible.  Shower prior to bedtime if exposed to triggers.  If allergic dust/dust mites recommend mattress/pillow covers/encasements; washing linens, vacuuming, sweeping, dusting weekly.  Call or return to clinic as needed if these symptoms worsen or fail to improve as anticipated.   Exitcare handout on allergic rhinitis and sinus rinse.  Patient verbalized understanding of instructions, agreed with plan of care and had no further questions at this time.  P2:  Avoidance and hand washing.

## 2022-05-15 ENCOUNTER — Other Ambulatory Visit: Payer: No Typology Code available for payment source | Admitting: Occupational Medicine

## 2022-05-15 DIAGNOSIS — Z0000 Encounter for general adult medical examination without abnormal findings: Secondary | ICD-10-CM

## 2022-05-15 NOTE — Progress Notes (Signed)
Lab drawn tolerated well no issues noted.   

## 2022-05-16 LAB — CMP12+LP+TP+TSH+6AC+CBC/D/PLT
ALT: 17 IU/L (ref 0–32)
AST: 19 IU/L (ref 0–40)
Albumin/Globulin Ratio: 1.8 (ref 1.2–2.2)
Albumin: 4.6 g/dL (ref 3.9–4.9)
Alkaline Phosphatase: 93 IU/L (ref 44–121)
BUN/Creatinine Ratio: 20 (ref 9–23)
BUN: 15 mg/dL (ref 6–24)
Basophils Absolute: 0 10*3/uL (ref 0.0–0.2)
Basos: 1 %
Bilirubin Total: 0.4 mg/dL (ref 0.0–1.2)
Calcium: 9.8 mg/dL (ref 8.7–10.2)
Chloride: 103 mmol/L (ref 96–106)
Chol/HDL Ratio: 3.1 ratio (ref 0.0–4.4)
Cholesterol, Total: 221 mg/dL — ABNORMAL HIGH (ref 100–199)
Creatinine, Ser: 0.75 mg/dL (ref 0.57–1.00)
EOS (ABSOLUTE): 0.2 10*3/uL (ref 0.0–0.4)
Eos: 4 %
Estimated CHD Risk: <0.5 times avg. (ref 0.0–1.0)
Free Thyroxine Index: 1.6 (ref 1.2–4.9)
GGT: 12 IU/L (ref 0–60)
Globulin, Total: 2.5 g/dL (ref 1.5–4.5)
Glucose: 88 mg/dL (ref 70–99)
HDL: 71 mg/dL (ref >39)
Hematocrit: 41.1 % (ref 34.0–46.6)
Hemoglobin: 13.7 g/dL (ref 11.1–15.9)
Immature Grans (Abs): 0 10*3/uL (ref 0.0–0.1)
Immature Granulocytes: 0 %
Iron: 100 ug/dL (ref 27–159)
LDH: 204 IU/L (ref 119–226)
LDL Chol Calc (NIH): 137 mg/dL — ABNORMAL HIGH (ref 0–99)
Lymphocytes Absolute: 2 10*3/uL (ref 0.7–3.1)
Lymphs: 32 %
MCH: 29.8 pg (ref 26.6–33.0)
MCHC: 33.3 g/dL (ref 31.5–35.7)
MCV: 90 fL (ref 79–97)
Monocytes Absolute: 0.6 10*3/uL (ref 0.1–0.9)
Monocytes: 10 %
Neutrophils Absolute: 3.3 10*3/uL (ref 1.4–7.0)
Neutrophils: 53 %
Phosphorus: 3.5 mg/dL (ref 3.0–4.3)
Platelets: 344 10*3/uL (ref 150–450)
Potassium: 4.4 mmol/L (ref 3.5–5.2)
RBC: 4.59 x10E6/uL (ref 3.77–5.28)
RDW: 12.3 % (ref 11.7–15.4)
Sodium: 139 mmol/L (ref 134–144)
T3 Uptake Ratio: 24 % (ref 24–39)
T4, Total: 6.8 ug/dL (ref 4.5–12.0)
TSH: 1.94 u[IU]/mL (ref 0.450–4.500)
Total Protein: 7.1 g/dL (ref 6.0–8.5)
Triglycerides: 73 mg/dL (ref 0–149)
Uric Acid: 5.4 mg/dL (ref 2.6–6.2)
VLDL Cholesterol Cal: 13 mg/dL (ref 5–40)
WBC: 6.1 10*3/uL (ref 3.4–10.8)
eGFR: 97 mL/min/{1.73_m2} (ref >59)

## 2022-05-16 LAB — HEMOGLOBIN A1C
Est. average glucose Bld gHb Est-mCnc: 103 mg/dL
Hgb A1c MFr Bld: 5.2 % (ref 4.8–5.6)

## 2022-05-16 LAB — VITAMIN D 25 HYDROXY (VIT D DEFICIENCY, FRACTURES): Vit D, 25-Hydroxy: 30.8 ng/mL (ref 30.0–100.0)

## 2022-05-17 ENCOUNTER — Encounter: Payer: Self-pay | Admitting: Registered Nurse

## 2022-05-19 ENCOUNTER — Ambulatory Visit: Payer: Self-pay | Admitting: Occupational Medicine

## 2022-05-19 VITALS — BP 134/86 | Ht 59.0 in | Wt 180.3 lb

## 2022-05-19 DIAGNOSIS — Z0000 Encounter for general adult medical examination without abnormal findings: Secondary | ICD-10-CM

## 2022-05-19 NOTE — Progress Notes (Signed)
Noted will encourage patient to increase to BID dosing consistently

## 2022-05-19 NOTE — Progress Notes (Signed)
Be well insurance premium discount evaluation: Met  Epic reviewed by RN Kimrey and transcribed labs.  Tobacco attestation signed. [facility] ROI formed signed. Forms placed in the chart.   Patient given handouts for [facility] pharmacies and discount drugs list,MyChart, Tele doc setup, Tele doc Behavioral, [place] counseling and [facility].  What to do for infectious illness protocol. Given handout for list of medications that can be filled at [facility]. Given Clinic hours and Clinic Email.  

## 2022-06-15 NOTE — Telephone Encounter (Signed)
Labs completed 06/14/22 see results note epic and Be Well paperwork signed 05/20/22

## 2022-07-06 NOTE — Progress Notes (Signed)
noted 

## 2022-07-06 NOTE — Progress Notes (Signed)
Noted follow up labs completed 2024

## 2022-08-06 ENCOUNTER — Ambulatory Visit: Payer: No Typology Code available for payment source | Admitting: Occupational Medicine

## 2022-08-06 DIAGNOSIS — M542 Cervicalgia: Secondary | ICD-10-CM

## 2022-08-06 DIAGNOSIS — M25512 Pain in left shoulder: Secondary | ICD-10-CM

## 2022-08-06 DIAGNOSIS — M79602 Pain in left arm: Secondary | ICD-10-CM

## 2022-08-06 NOTE — Progress Notes (Signed)
Patient reports putting a chair together last night and this morning woke up with pain to left neck, shoulder and arm pain. Noted when spasms has numbness to left middle and ring finger. 8/10 pain. She took aleve this am didn't help. [employer] and given tylenol. Educated can applied Biofreeze once heat pack done. Can take tylenol every 4 hrs. If not improving to contact the clinic to schedule appt with Inetta Fermo Np in the am. Before leaving said heat was helping.

## 2022-08-07 ENCOUNTER — Telehealth: Payer: Self-pay | Admitting: Registered Nurse

## 2022-08-07 ENCOUNTER — Encounter: Payer: Self-pay | Admitting: Registered Nurse

## 2022-08-07 ENCOUNTER — Ambulatory Visit: Payer: No Typology Code available for payment source | Admitting: Registered Nurse

## 2022-08-07 DIAGNOSIS — M62830 Muscle spasm of back: Secondary | ICD-10-CM

## 2022-08-07 MED ORDER — CYCLOBENZAPRINE HCL 10 MG PO TABS
10.0000 mg | ORAL_TABLET | Freq: Three times a day (TID) | ORAL | Status: AC | PRN
Start: 2022-08-07 — End: 2022-08-14

## 2022-08-07 NOTE — Telephone Encounter (Signed)
Patient cancelled appt for today stated found flexeril left over at home last night Rx 2023 took dose worked well for her feeling better today.  Heat, ice, stretches, biofreeze and motrin prn.  Denied loss of bowel/bladder control, falls or arm/leg weakness or saddle paresthesias.  A&Ox3 spoke full sentences without difficulty gait sure and steady respirations even and unlabored stated slept well last night.  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  Patient verbalized understanding information/instructions, agreed with plan of care and had no further questions at this time.

## 2022-08-14 LAB — COLOGUARD: COLOGUARD: POSITIVE — AB

## 2022-08-18 ENCOUNTER — Other Ambulatory Visit: Payer: Self-pay | Admitting: *Deleted

## 2022-08-18 DIAGNOSIS — R195 Other fecal abnormalities: Secondary | ICD-10-CM

## 2022-10-21 ENCOUNTER — Encounter: Payer: Self-pay | Admitting: Nurse Practitioner

## 2022-10-22 ENCOUNTER — Ambulatory Visit: Payer: No Typology Code available for payment source | Admitting: Nurse Practitioner

## 2022-11-17 ENCOUNTER — Ambulatory Visit: Payer: No Typology Code available for payment source | Admitting: Nurse Practitioner

## 2022-11-17 NOTE — Progress Notes (Deleted)
   Madison Garner 11-Jul-1970 [id number]   History:  52 y.o. G2P2002 presents for annual exam. Postmenopausal - no HRT. History of abnormal pap smears, negative colposcopies x2 while pregnant with daughter 20 years ago. Normal paps since.   Gynecologic History No LMP recorded. Patient is perimenopausal.   Contraception/Family planning: vasectomy Sexually active: Yes  Health Maintenance Last Pap: 09/19/2020. Results were: Normal, 5-year repeat Last mammogram: 02/12/2021. Results were: Normal Last colonoscopy: Positive Cologuard 07/2022 with recommendations for colonoscopy Last Dexa: Not indicated  Past medical history, past surgical history, family history and social history were all reviewed and documented in the EPIC chart. Boyfriend of 23 years, father of children. 2 daughters ages 91 and 70. Mother with history of cervical cancer in her 65s.   ROS:  A ROS was performed and pertinent positives and negatives are included.  Exam:  There were no vitals filed for this visit.   There is no height or weight on file to calculate BMI.  General appearance:  Normal Thyroid:  Symmetrical, normal in size, without palpable masses or nodularity. Respiratory  Auscultation:  Clear without wheezing or rhonchi Cardiovascular  Auscultation:  Regular rate, without rubs, murmurs or gallops  Edema/varicosities:  Not grossly evident Abdominal  Soft,nontender, without masses, guarding or rebound.  Liver/spleen:  No organomegaly noted  Hernia:  None appreciated  Skin  Inspection:  Grossly normal Breasts: Examined lying and sitting.   Right: Without masses, retractions, nipple discharge or axillary adenopathy.   Left: Without masses, retractions, nipple discharge or axillary adenopathy. Pelvic: External genitalia:  no lesions              Urethra:  normal appearing urethra with no masses, tenderness or lesions              Bartholins and Skenes: normal                 Vagina: normal appearing  vagina with normal color and discharge, no lesions              Cervix: no lesions Bimanual Exam:  Uterus:  no masses or tenderness              Adnexa: no mass, fullness, tenderness              Rectovaginal: Deferred              Anus:  normal, no lesions  Patient informed chaperone available to be present for breast and pelvic exam. Patient has requested no chaperone to be present. Patient has been advised what will be completed during breast and pelvic exam.   Assessment/Plan:  52 y.o. G2P2002 for annual exam.   Well female exam with routine gynecological exam - Education provided on SBEs, importance of preventative screenings, current guidelines, high calcium diet, regular exercise, and multivitamin daily.  Labs through work.   Postmenopausal - no HRT, no bleeding.   Screening for cervical cancer - History of abnormal pap smears, negative colposcopies x2 while pregnant with daughter 20 years ago. No interventions. Normal paps since but they have been infrequent.  Will repeat at 5-year interval per guidelines.   Screening for breast cancer - Normal mammogram history. Overdue and encouraged to schedule.  Normal breast exam today.  Return in 1 year for annual.     Olivia Mackie DNP, 7:58 AM 11/17/2022

## 2022-11-20 ENCOUNTER — Encounter: Payer: Self-pay | Admitting: Registered Nurse

## 2022-11-20 ENCOUNTER — Ambulatory Visit: Payer: Self-pay | Admitting: Registered Nurse

## 2022-11-20 VITALS — BP 150/95 | HR 71 | Temp 98.6°F

## 2022-11-20 DIAGNOSIS — R030 Elevated blood-pressure reading, without diagnosis of hypertension: Secondary | ICD-10-CM

## 2022-11-20 DIAGNOSIS — H6591 Unspecified nonsuppurative otitis media, right ear: Secondary | ICD-10-CM

## 2022-11-20 MED ORDER — AMOXICILLIN-POT CLAVULANATE 875-125 MG PO TABS: 1.0000 | ORAL_TABLET | Freq: Two times a day (BID) | ORAL | 0 refills | Status: AC

## 2022-11-20 NOTE — Progress Notes (Signed)
Pt reports left ear pain. 2/10 at this time. Pt states that she had left neck pain that started 4 days ago and resolved two days ago. Pt states that the ear pain stared 1 day ago and has increased in pain. Pt reports taking sudafed w/o relief. Denies any drainage, hearing changes, headache or sinus pressure at this time.

## 2022-11-20 NOTE — Progress Notes (Signed)
Subjective:    Patient ID: Madison Garner, female    DOB: 08/04/1970, 52 y.o.   MRN: [id number]  52y/o established caucasian female has had ear popping, pressure and pain for a couple days.  Would like check for ear infection.  Saw RN yesterday and told some bulging of TM and erythema.  Denied f/c/n/v/d/ear discharge/bleeding/dizziness.  Using OTC medications without improvement. Swelling noted behind left ear; left ear pain worse than right      Review of Systems  Constitutional:  Negative for chills and fever.  HENT:  Positive for congestion, ear pain and postnasal drip. Negative for ear discharge, facial swelling, mouth sores, trouble swallowing and voice change.   Eyes:  Negative for photophobia and visual disturbance.  Respiratory:  Negative for cough, shortness of breath, wheezing and stridor.   Cardiovascular:  Negative for chest pain.  Gastrointestinal:  Negative for abdominal pain, diarrhea, nausea and vomiting.  Genitourinary:  Negative for difficulty urinating.  Musculoskeletal:  Negative for gait problem, neck pain and neck stiffness.  Skin:  Negative for rash.  Allergic/Immunologic: Positive for environmental allergies.  Neurological:  Negative for dizziness, tremors, seizures, syncope, facial asymmetry, speech difficulty, weakness, light-headedness, numbness and headaches.  Psychiatric/Behavioral:  Negative for agitation, confusion and sleep disturbance.        Objective:   Physical Exam Vitals and nursing note reviewed.  Constitutional:      General: She is awake. She is not in acute distress.    Appearance: Normal appearance. She is well-developed, well-groomed and overweight. She is not ill-appearing, toxic-appearing or diaphoretic.  HENT:     Head: Normocephalic and atraumatic.     Jaw: There is normal jaw occlusion.     Salivary Glands: Right salivary gland is not diffusely enlarged or tender. Left salivary gland is not diffusely enlarged or tender.     Right  Ear: Hearing and external ear normal. No decreased hearing noted. No laceration, drainage, swelling or tenderness. A middle ear effusion is present. There is no impacted cerumen. No foreign body. No mastoid tenderness. No PE tube. No hemotympanum. Tympanic membrane is erythematous. Tympanic membrane is not injected, scarred, perforated or retracted.     Left Ear: Hearing and external ear normal. No decreased hearing noted. No laceration, drainage, swelling or tenderness. A middle ear effusion is present. There is no impacted cerumen. No foreign body. No mastoid tenderness. No PE tube. No hemotympanum. Tympanic membrane is not injected, scarred, perforated, erythematous or retracted.     Ears:     Comments: Bilateral TMs intact air fluid level moderate opacity right and left scant opacity air fluid level; erythema canal 6 oclock and TM periphery; no debris noted bilateral auditory canals    Nose: Nose normal. No congestion or rhinorrhea.     Right Turbinates: Enlarged and swollen. Not pale.     Left Turbinates: Enlarged and swollen. Not pale.     Right Sinus: No maxillary sinus tenderness or frontal sinus tenderness.     Left Sinus: No maxillary sinus tenderness or frontal sinus tenderness.     Comments: Bilateral nasal turbinates edema/erythema clear discharge; bilateral allergic shiners; bilateral lower eyelids nonpitting edema 1+/4    Mouth/Throat:     Lips: Pink. No lesions.     Mouth: Mucous membranes are dry. No oral lesions or angioedema.     Dentition: No gum lesions.     Tongue: No lesions. Tongue does not deviate from midline.     Palate: No mass and lesions.  Pharynx: Uvula midline. Pharyngeal swelling, posterior oropharyngeal erythema and postnasal drip present. No oropharyngeal exudate or uvula swelling.     Tonsils: No tonsillar exudate or tonsillar abscesses.     Comments: Cobblestoning postierior pharynx; mucous membranes dry tongue Eyes:     General: Lids are normal. Vision  grossly intact. Gaze aligned appropriately. Allergic shiner present. No scleral icterus.       Right eye: No discharge.        Left eye: No discharge.     Extraocular Movements: Extraocular movements intact.     Conjunctiva/sclera: Conjunctivae normal.     Pupils: Pupils are equal, round, and reactive to light.  Neck:     Trachea: Trachea and phonation normal.  Cardiovascular:     Rate and Rhythm: Normal rate and regular rhythm.     Pulses:          Radial pulses are 2+ on the right side and 2+ on the left side.  Pulmonary:     Effort: Pulmonary effort is normal.     Breath sounds: Normal breath sounds and air entry. No stridor, decreased air movement or transmitted upper airway sounds. No decreased breath sounds, wheezing or rhonchi.     Comments: Spoke full sentences without difficulty; no cough observed in exam room Abdominal:     General: Abdomen is flat.  Musculoskeletal:        General: Normal range of motion.     Right hand: Normal strength. Normal capillary refill.     Left hand: Normal strength. Normal capillary refill.     Cervical back: Normal range of motion and neck supple. No edema, erythema, signs of trauma, rigidity, torticollis or crepitus. No pain with movement. Normal range of motion.  Lymphadenopathy:     Head:     Right side of head: No submental, submandibular, tonsillar, preauricular, posterior auricular or occipital adenopathy.     Left side of head: Posterior auricular adenopathy present. No submental, submandibular, tonsillar, preauricular or occipital adenopathy.     Cervical:     Right cervical: No superficial, deep or posterior cervical adenopathy.    Left cervical: No superficial, deep or posterior cervical adenopathy.     Comments: Shotty left posterior auricular lymph node  Skin:    General: Skin is warm and dry.     Capillary Refill: Capillary refill takes less than 2 seconds.     Coloration: Skin is not ashen, cyanotic, jaundiced, mottled, pale or  sallow.     Findings: No abrasion, abscess, acne, bruising, burn, ecchymosis, erythema, signs of injury, laceration, lesion, petechiae, rash or wound.     Nails: There is no clubbing.     Comments: Face/neck/arms evaluated  Neurological:     General: No focal deficit present.     Mental Status: She is alert and oriented to person, place, and time. Mental status is at baseline.     GCS: GCS eye subscore is 4. GCS verbal subscore is 5. GCS motor subscore is 6.     Cranial Nerves: Cranial nerves 2-12 are intact. No cranial nerve deficit, dysarthria or facial asymmetry.     Sensory: Sensation is intact.     Motor: Motor function is intact. No weakness, tremor, atrophy, abnormal muscle tone or seizure activity.     Coordination: Coordination is intact. Coordination normal.     Gait: Gait is intact. Gait normal.     Comments: In/out of chair without difficulty; gait sure and steady in clinic; bilateral hand grasp equal  5/5  Psychiatric:        Attention and Perception: Attention and perception normal.        Mood and Affect: Mood and affect normal.        Speech: Speech normal.        Behavior: Behavior normal. Behavior is cooperative.        Thought Content: Thought content normal.        Cognition and Memory: Cognition and memory normal.        Judgment: Judgment normal.     Left post auricular lymph nodes enlarged/shotty today Opacity greater right ear than left and canal erythematous right; air fluid level cloudy bilateral TMs intact      Assessment & Plan:   A-acute right otitis media with effusion, elevated blood pressure reading  P-Supportive treatment. Augmentin 875mg  po BID x 10 days #20 RF0 electronic Rx to her pharmacy of choice  Tylenol 1000mg  po QID prn pain/fever.   No evidence of invasive bacterial infection, non toxic and well hydrated. .  I do not see where any further testing or imaging is necessary at this time.   I will suggest supportive care, rest, good hygiene and  encourage the patient to take adequate fluids.  The patient is to return to clinic or EMERGENCY ROOM if symptoms worsen or change significantly e.g. ear pain, fever, purulent discharge from ears or bleeding.  Exitcare handout on otitis media   Patient verbalized agreement and understanding of treatment plan and had no further questions at this time.  Follow up next week repeat BP acute illness/pain today and stress at work  Seek re-evaluation if worst headache of life, chest pain, visual changes, dyspnea same day.  Hydrate with water due to dry mucous membranes.  Consider DASH diet/avoiding caffeine this afternoon.  Patient agreed with plan of care and had no further questions at this time.

## 2022-11-20 NOTE — Patient Instructions (Signed)

## 2022-11-24 ENCOUNTER — Ambulatory Visit: Payer: No Typology Code available for payment source | Admitting: Gastroenterology

## 2022-11-25 ENCOUNTER — Ambulatory Visit: Payer: Self-pay | Admitting: Registered Nurse

## 2022-11-25 ENCOUNTER — Encounter: Payer: Self-pay | Admitting: Registered Nurse

## 2022-11-25 VITALS — HR 79

## 2022-11-25 DIAGNOSIS — M25562 Pain in left knee: Secondary | ICD-10-CM

## 2022-11-25 DIAGNOSIS — M7651 Patellar tendinitis, right knee: Secondary | ICD-10-CM

## 2022-11-25 NOTE — Progress Notes (Signed)
Subjective:    Patient ID: Madison Garner, female    DOB: [birth year], 52 y.o.   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  Has noticed popping.  Taking aleve po prn OTC.  Tried to rest over the weekend prepping for increased activity this week.  Denied trauma/injury.  Has noticed left knee popping intermittent with weight bearing/walking this week.      Review of Systems  Constitutional:  Negative for chills and fever.  Respiratory:  Negative for cough.   Genitourinary:  Negative for difficulty urinating.  Musculoskeletal:  Positive for arthralgias, joint swelling and myalgias. Negative for gait problem, neck pain and neck stiffness.  Skin:  Negative for color change, pallor, rash and wound.  Neurological:  Negative for tremors, syncope, weakness and numbness.  Psychiatric/Behavioral:  Negative for agitation, confusion and sleep disturbance.        Objective:   Physical Exam Vitals and nursing note reviewed.  Constitutional:      General: She is awake. She is not in acute distress.    Appearance: Normal appearance. She is well-developed, well-groomed and overweight. She is not ill-appearing, toxic-appearing or diaphoretic.  HENT:     Head: Normocephalic and atraumatic.     Jaw: There is normal jaw occlusion.     Salivary Glands: Right salivary gland is not diffusely enlarged. Left salivary gland is not diffusely enlarged.     Right Ear: Hearing and external ear normal.     Left Ear: Hearing and external ear normal.     Nose: Nose normal. No congestion or rhinorrhea.     Mouth/Throat:     Lips: Pink. No lesions.     Mouth: Mucous membranes are moist.     Pharynx: Oropharynx is clear.  Eyes:     General: Lids are normal. Vision grossly intact. Gaze aligned appropriately. No scleral icterus.       Right eye: No discharge.         Left eye: No discharge.     Extraocular Movements: Extraocular movements intact.     Conjunctiva/sclera: Conjunctivae normal.     Pupils: Pupils are equal, round, and reactive to light.  Neck:     Trachea: Trachea normal.  Cardiovascular:     Rate and Rhythm: Normal rate and regular rhythm.     Pulses: Normal pulses.  Pulmonary:     Effort: Pulmonary effort is normal.     Breath sounds: Normal breath sounds and air entry. No stridor or transmitted upper airway sounds. No wheezing.     Comments: Spoke full sentences without difficulty; no cough observed in exam room Abdominal:     Palpations: Abdomen is soft.  Musculoskeletal:        General: Swelling and tenderness present. No deformity or signs of injury.     Right hand: Normal strength. Normal capillary refill.     Left hand: Normal strength. Normal capillary refill.     Cervical back: Normal range of motion and neck supple. No swelling, edema, deformity, erythema, signs of trauma, lacerations, rigidity, spasms, torticollis, tenderness, bony tenderness or crepitus. No pain with movement. Normal range of motion.     Thoracic back: No swelling, edema, deformity, signs of trauma, lacerations, spasms, tenderness or bony tenderness. Normal range of motion.     Right hip: No deformity, lacerations, tenderness, bony tenderness or crepitus. Normal range of motion. Normal  strength.     Left hip: No deformity, lacerations, tenderness, bony tenderness or crepitus. Normal range of motion. Normal strength.     Right upper leg: No swelling, edema, deformity, lacerations, tenderness or bony tenderness.     Left upper leg: Tenderness present. No swelling, edema, deformity, lacerations or bony tenderness.     Right knee: Crepitus present. No swelling, deformity, effusion, erythema, ecchymosis or lacerations. Normal range of motion. No tenderness. No medial joint line, lateral joint line, MCL, LCL or ACL tenderness. No LCL laxity, MCL laxity, ACL  laxity or PCL laxity.     Left knee: Swelling, effusion and crepitus present. No deformity, erythema, ecchymosis or lacerations. Decreased range of motion. Tenderness present. No medial joint line, lateral joint line, MCL, LCL or ACL tenderness. No LCL laxity, MCL laxity, ACL laxity or PCL laxity.Normal meniscus.     Right lower leg: No edema.     Left lower leg: No edema.       Legs:     Comments: Left IT band distal TTP lateral; negative valgus/varus tests/lachmann's/anterior/posterior drawer bilaterally; diameter left central patella 15.75" left knee fossa feels fuller than right; bursa not TTP but patient reported kneeling on left knee painful; left slow to flexion and decreased from right 15 degrees due to pain  Lymphadenopathy:     Head:     Right side of head: No submandibular or preauricular adenopathy.     Left side of head: No submandibular or preauricular adenopathy.     Cervical: No cervical adenopathy.     Right cervical: No superficial cervical adenopathy.    Left cervical: No superficial cervical adenopathy.  Skin:    General: Skin is warm and dry.     Capillary Refill: Capillary refill takes less than 2 seconds.     Coloration: Skin is not ashen, cyanotic, jaundiced, mottled, pale or sallow.     Findings: No abrasion, abscess, acne, bruising, burn, ecchymosis, erythema, signs of injury, laceration, lesion, petechiae, rash or wound.     Nails: There is no clubbing.     Comments: Face/neck/forearms/lower legs knee to ankle  Neurological:     General: No focal deficit present.     Mental Status: She is alert and oriented to person, place, and time. Mental status is at baseline.     GCS: GCS eye subscore is 4. GCS verbal subscore is 5. GCS motor subscore is 6.     Cranial Nerves: Cranial nerves 2-12 are intact. No cranial nerve deficit, dysarthria or facial asymmetry.     Sensory: Sensation is intact.     Motor: Motor function is intact. No weakness, tremor, atrophy, abnormal  muscle tone or seizure activity.     Coordination: Coordination is intact. Coordination normal.     Gait: Gait is intact. Gait normal.     Comments: In/out of chair and on/off exam table without difficulty; gait sure and steady in clinic; bilateral hand grasp equal 5/5  Psychiatric:        Attention and Perception: Attention and perception normal.        Mood and Affect: Mood and affect normal.        Speech: Speech normal.        Behavior: Behavior normal. Behavior is cooperative.        Thought Content: Thought content normal.        Cognition and Memory: Cognition and memory normal.        Judgment: Judgment normal.  Assessment & Plan:   A-patellar tendonitis bilateral; acute left knee pain  P-Patient was instructed to decrease walking/kneeling/squatting/ladders/stairs this week at home and avoid running/jumping/squats and lunges until symptoms improved. Elevate legs when sitting.  Motrin/ibuprofen/advil 800mg  po TID prn pain or aleve/naproxen/naprosyn 500mg  by mouth every 12 hours as needed pain; take at least daily with food x 2 weeks and/or tylenol 1000mg  po q6h prn pain   Medications as directed. Patient may take NSAIDS as needed avoid diclofenac, voltaren, naproxen, aleve, or naprosyn OTC when taking motrin. Use kneeling pad on concrete floor.  Call or return to clinic as needed if these symptoms worsen or fail to improve as anticipated.  Follow up re-evaluation in 2 weeks or sooner prn worsening.  If patient requires work restrictions will need appt with orthopedics or [facility].  Patient to see RN Rolly Salter to schedule.  Exitcare handouts patellar tendonitis and rehab exercises printed and given to patient.  Gentle AROM and wearing knee neoprene sleeve while active.  Do not need to wear knee support while sleeping.  May hand wash knee support.  Do not put in washer or dryer.  May use blow dryer to speed drying.  Cryotherapy 15 minutes QID prn pain/swelling.  Patient  will need to follow up with [facility] if new work restrictions required as I am unable to write or release patient from restrictions in this clinic due to contract limitations. Patient verbalized agreement and understanding of treatment plan and had no further questions at this time.   P2: ROM exercises, Stretching, and Hand outs given    Discussed ice at lunch and after work.  Avoid new exercise programs or ramping up over the next 2 weeks  may continue aleve OTC po prn.  Biofreeze topical gel QID prn pain given 4 UD from clinic stock. Fitted and distributed medium knee sleeve from clinic stock x 1. 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  Patient agreed with plan of care and had no further questions at this time.

## 2022-11-25 NOTE — Patient Instructions (Signed)
Patellar Tendinitis Rehab Ask your health care provider which exercises are safe for you. Do exercises exactly as told by your health care provider and adjust them as directed. It is normal to feel mild stretching, pulling, tightness, or discomfort as you do these exercises. Stop right away if you feel sudden pain or your pain gets worse. Do not begin these exercises until told by your health care provider. Stretching and range-of-motion exercise This exercise warms up your muscles and joints and improves the movement and flexibility of your knee. The exercise also helps to relieve pain and stiffness. Hamstring, doorway stretch This is an exercise in which you lie in a doorway and prop your leg on a wall to stretch the back of your knee and thigh (hamstring). Lie on your back in front of a doorway with your left / right leg resting against the wall and your other leg flat on the floor in the doorway. There should be a slight bend in your left / right knee. Straighten your left / right knee. You should feel a stretch behind your knee or thigh. If you do not, scoot your buttocks closer to the door. Hold this position for _____30_____ seconds. Repeat ______3____ times. Complete this exercise _____2_____ times a day. Strengthening exercises These exercises build strength and endurance in your knee. Endurance is the ability to use your muscles for a long time, even after they get tired. Quadriceps, isometric This exercise stretches the muscles in front of your thigh (quadriceps) without moving your knee joint (isometric). Lie on your back with your left / right leg extended and your other knee bent. Slowly tense the muscles in the front of your left / right thigh. When you do this, you should see your kneecap slide up toward your hip or see increased dimpling just above the knee. This motion will push the back of your knee toward the floor. If this is painful, try putting a rolled-up hand towel under your  knee to support it in a bent position. Change the size of the towel to find a position that allows you to do this exercise without any pain. For ___30_______ seconds, hold the muscle as tight as you can without increasing your pain. Relax the muscles slowly and completely. Repeat __3________ times. Complete this exercise ______2____ times a day. Straight leg raises, flexors This exercise stretches the muscles in front of your thigh (quadriceps) and the muscles that move your hips (hip flexors). Lie on your back with your left / right leg extended and your other knee bent. Tense the muscles in the front of your left / right thigh. When you do this, you should see your kneecap slide up or see increased dimpling just above the knee. Keep these muscles tight as you raise your leg 4-6 inches (10-15 cm) off the floor. Do not let your moving knee bend. Hold this position for _____30_____ seconds. Keep these muscles tense as you slowly lower your leg. Relax your muscles slowly and completely. Repeat ____3______ times. Complete this exercise ______2____ times a day. Squats This is a weight-bearing exercise in which you bend your knees and lower your hips while engaging your thigh muscles. Stand in front of a table, with your feet and knees pointing straight ahead. You may rest your hands on the table for balance but not for support. Slowly bend your knees and lower your hips like you are going to sit in a chair. Keep your weight over your heels, not over your toes.  Keep your lower legs upright so they are parallel with the table legs. Do not let your hips go lower than your knees. Do not bend lower than told by your health care provider. If your knee pain increases, do not bend as low. Hold the squat position for ____30______ seconds. Slowly push with your legs to return to standing. Do not use your hands to pull yourself to standing. Repeat ____3______ times. Complete this exercise _______2___ times  a day. Step-downs This is an exercise in which you step down slowly while engaging your leg muscles. Stand on the edge of a step. Keeping your weight over your left / right heel, slowly bend your left / right knee to bring your left / right heel toward the floor. Lower your heel as far as you can while keeping control and without increasing any discomfort. Do not let your left / right knee come forward. Use your leg muscles, not gravity, to lower your body. Hold on to a wall or rail for balance if needed. Slowly push through your heel to lift your body weight back up. Return to the starting position. Repeat ____3______ times. Complete this exercise ______2____ times a day. Straight leg raises, abductors This exercise strengthens the muscles that rotate the leg at the hip and move it away from your body (hip abductors). Lie on your side with your left / right leg in the top position. Lie so your head, shoulder, knee, and hip line up. You may bend your bottom knee to help you keep your balance. Roll your hips slightly forward, so that your hips are stacked directly over each other and your left / right knee is facing forward. Leading with your heel, lift your top leg 4-6 inches (10-15 cm). You should feel the muscles in your outer hip lifting. Do not let your foot drift forward. Do not let your knee roll toward the ceiling. Hold this position for ___30_______ seconds. Slowly lower your leg to the starting position. Let your muscles relax completely after each repetition. Repeat ____3______ times. Complete this exercise _____2_____ times a day. This information is not intended to replace advice given to you by your health care provider. Make sure you discuss any questions you have with your health care provider. Document Revised: 08/14/2020 Document Reviewed: 08/14/2020 [employer] Patient Education  2024 [employer] Acute Knee Pain, Adult Many things can cause knee pain. Sometimes, knee pain  is sudden (acute). It may be caused by damage, swelling, or irritation of the muscles and tissues that support your knee. Pain may come from: A fall. An injury to the knee from twisting motions. A hit to the knee. Infection. The pain often goes away on its own with time and rest. If the pain does not go away, tests may be done to find out what is causing the pain. These may include: Imaging tests, such as an X-ray, MRI, CT scan, or ultrasound. Joint aspiration. In this test, fluid is removed from the knee and checked. Arthroscopy. In this test, a lighted tube is put in the knee and an image is shown on a screen. A biopsy. In this test, a health care provider will remove a small piece of tissue for testing. Follow these instructions at home: If you have a knee sleeve or brace that can be taken off:  Wear the knee sleeve or brace as told by your provider. Take it off only if your provider says that you can. Check the skin around it every day.  Tell your provider if you see problems. Loosen the knee sleeve or brace if your toes tingle, are numb, or turn cold and blue. Keep the knee sleeve or brace clean and dry. Bathing If the knee sleeve or brace is not waterproof: Do not let it get wet. Cover it when you take a bath or shower. Use a cover that does not let any water in. Managing pain, stiffness, and swelling  If told, put ice on the area. If you have a knee sleeve or brace that you can take off, remove it as told. Put ice in a plastic bag. Place a towel between your skin and the bag. Leave the ice on for 20 minutes, 2-3 times a day. If your skin turns bright red, take off the ice right away to prevent skin damage. The risk of damage is higher if you cannot feel pain, heat, or cold. Move your toes often to reduce stiffness and swelling. Raise the injured area above the level of your heart while you are sitting or lying down. Use a pillow to support your foot as needed. If told, use an  elastic bandage to put pressure (compression) on your injured knee. This may control swelling, give support, and help with discomfort. Sleep with a pillow under your knee. Activity Rest your knee. Do not do things that cause pain or make pain worse. Do not stand or walk on your injured knee until you're told it's okay. Use crutches as told. Avoid activities where both feet leave the ground at the same time and put stress on the joints. Avoid running, jumping rope, and doing jumping jacks. Work with a physical therapist to make a safe exercise program if told. Physical therapy helps your knee move better and get stronger. Exercise as told. General instructions Take your medicines only as told by your provider. If you are overweight, work with your provider and an expert in healthy eating, called a dietician, to set goals to lose weight. Being overweight can make your knee hurt more. Do not smoke, vape, or use products with nicotine or tobacco in them. If you need help quitting, talk with your provider. Return to normal activities when you are told. Ask what things are safe for you to do. Watch for any changes in your symptoms. Keep all follow-up visits. Your provider will check your healing and adjust treatments if needed. Contact a health care provider if: The knee pain does not stop. The knee pain changes or gets worse. You have a fever along with knee pain. Your knee is red or feels warm when you touch it. Your knee gives out or locks up. Get help right away if: Your knee swells and the swelling gets worse. You cannot move your knee. You have very bad knee pain that does not get better with medicine. This information is not intended to replace advice given to you by your health care provider. Make sure you discuss any questions you have with your health care provider. Document Revised: 03/03/2022 Document Reviewed: 03/03/2022 Else[employer] Patient Education  2024 [employer] Patellar  Tendinitis  Patellar tendinitis is also called jumper's knee or patellar tendinopathy. This condition happens when there is damage to the patellar tendon. Tendons are cord-like tissues that connect muscles to bones. The patellar tendon connects the bottom of the kneecap (patella) to the top of the shin bone (tibia). Patellar tendinitis causes pain in the front of the knee. The condition is classified into the following stages: Stage 1: You  have pain only after activity. Stage 2: You have pain during and after activity. Stage 3: You have pain at rest as well as during and after activity. The pain limits your ability to do the activity. Stage 4: You have tendon tears. The tears severely limit your activity. What are the causes? This condition is caused by repeated (repetitive) stress on the tendon. This stress may cause the tendon to stretch, swell, thicken, or tear. What increases the risk? The following factors may make you more likely to develop this condition: Participating in sports that involve running, kicking, and jumping, especially on hard surfaces. These include: Basketball. Volleyball. Soccer. Track and field. Training too hard. Having tight thigh muscles. Having received steroid injections in the tendon. Having had knee surgery. Being 37-59 years old. Having rheumatoid arthritis, diabetes, or kidney disease. These conditions interrupt blood flow to the knee, causing the tendon to weaken. What are the signs or symptoms? The main symptom of this condition is pain and swelling in the front of the knee. The pain usually starts slowly and gradually gets worse. It may become painful to straighten your leg. The pain may get worse when you walk, run, or jump. How is this diagnosed? This condition may be diagnosed based on: Your symptoms. Your medical history. A physical exam. During the physical exam, your health care provider may check for: Tenderness along the tendon just below the  patella. Tightness in your thigh muscles. Pain when you straighten your knee. Imaging tests, including: X-rays. These will show the position and condition of your patella. An MRI. This will show any abnormality of the tendon. Ultrasound. This will show any swelling or other abnormalities of the tendon. How is this treated? Treatment for this condition depends on the stage of the condition. It may involve: Avoiding activities that cause pain, such as jumping. Icing and elevating your knee. Having sound wave stimulation to promote healing. Doing physical therapy exercises to improve movement and strength in your knee when pain and swelling improve. Wearing a knee brace. This may be needed if your condition does not improve with treatment. Using crutches or a walker. This may be needed if your condition does not improve with treatment. Surgery. This may be done if you have stage 4 tendinitis. Follow these instructions at home: If you have a removable brace: Wear the brace as told by your health care provider. Remove it only as told by your health care provider. Check the skin around the brace every day. Tell your health care provider about any concerns. Loosen the brace if your toes tingle, become numb, or turn cold and blue. Keep the brace clean. If the brace is not waterproof: Do not let it get wet. Cover it with a watertight covering when you take a bath or shower. Ask your health care provider when it is safe for you to drive. Managing pain, stiffness, and swelling  If directed, put ice on the injured area. To do this: If you have a removable brace, remove it as told by your health care provider. Put ice in a plastic bag. Place a towel between your skin and the bag. Leave the ice on for 20 minutes, 2-3 times a day. Remove the ice if your skin turns bright red. This is very important. If you cannot feel pain, heat, or cold, you have a greater risk of damage to the area. Move your  toes often to reduce stiffness and swelling. Raise (elevate) your knee above the level of  your heart while you are sitting or lying down. Activity Do not use the injured limb to support your body weight until your health care provider says that you can. Use your crutches or a walker as told by your health care provider. Do exercises as told by your health care provider or physical therapist. Return to your normal activities as told by your health care provider. Ask your health care provider what activities are safe for you. General instructions Take over-the-counter and prescription medicines only as told by your health care provider. Do not use any products that contain nicotine or tobacco. These products include cigarettes, chewing tobacco, and vaping devices, such as e-cigarettes. These can delay healing. If you need help quitting, ask your health care provider. Keep all follow-up visits. This is important. How is this prevented? Warm up and stretch before being active. Cool down and stretch after being active. Give your body time to rest between periods of activity. You may need to reduce how often you play a sport that requires frequent jumping. Make sure to use equipment that fits you. Be safe and responsible while being active. This will help you avoid falls which can damage the tendon. Do at least 150 minutes of moderate-intensity exercise each week, such as brisk walking or water aerobics. Maintain physical fitness, including: Strength. Flexibility. Cardiovascular fitness. Endurance. Contact a health care provider if: Your symptoms have not improved in 6 weeks. Your symptoms get worse. Summary Patellar tendinitis is also called jumper's knee or patellar tendinopathy. This condition happens when there is damage to the patellar tendon. Treatment for this condition depends on the stage of the condition and may include rest, ice, exercises, a knee brace, and surgery. Do not use the  injured limb to support your body weight until your health care provider says that you can. Take over-the-counter and prescription medicines only as told by your health care provider. This information is not intended to replace advice given to you by your health care provider. Make sure you discuss any questions you have with your health care provider. Document Revised: 08/14/2020 Document Reviewed: 08/14/2020 Elsevier Patient Education  2024 [employer].

## 2022-11-27 ENCOUNTER — Encounter: Payer: Self-pay | Admitting: Registered Nurse

## 2022-11-27 DIAGNOSIS — J301 Allergic rhinitis due to pollen: Secondary | ICD-10-CM

## 2022-11-27 MED ORDER — FEXOFENADINE HCL 180 MG PO TABS: 180.0000 mg | ORAL_TABLET | Freq: Every day | ORAL | 3 refills | Status: DC

## 2022-11-27 NOTE — Telephone Encounter (Signed)
Electronic Rx sent to patient pharmacy of choice fexofenadine 180mg  po daily #90 RF3  Patient notified.  Working well for patient seasonal allergies.

## 2023-01-29 ENCOUNTER — Telehealth: Payer: Self-pay | Admitting: Registered Nurse

## 2023-01-29 ENCOUNTER — Encounter: Payer: Self-pay | Admitting: Registered Nurse

## 2023-01-29 DIAGNOSIS — E785 Hyperlipidemia, unspecified: Secondary | ICD-10-CM | POA: Insufficient documentation

## 2023-01-29 NOTE — Telephone Encounter (Signed)
 Patient unsure how many brazil nuts she was supposed to be eating to help with her cholesterol.  Here to discuss.  Discussed 4 brazil nuts per month or may do 1 per week.  Patient asked how long a bag of brazil nuts can be kept discussed cool dry place not in sun and should last a year.  Patient stated she was taking 4 per week.  Discussed eating more than recommended amount can lead to selenium overdose.  Patient stated she will cut down on number of brazil nuts she is eating and do one per week from today.  Patient verbalized understanding information/instructions and had no further questions at this time.

## 2023-02-03 ENCOUNTER — Other Ambulatory Visit: Payer: Self-pay | Admitting: Nurse Practitioner

## 2023-02-03 DIAGNOSIS — Z1231 Encounter for screening mammogram for malignant neoplasm of breast: Secondary | ICD-10-CM

## 2023-02-18 ENCOUNTER — Ambulatory Visit
Admission: RE | Admit: 2023-02-18 | Discharge: 2023-02-18 | Disposition: A | Discharge status: Home / Self Care | Payer: No Typology Code available for payment source | Source: Ambulatory Visit | Attending: Nurse Practitioner

## 2023-02-18 DIAGNOSIS — Z1231 Encounter for screening mammogram for malignant neoplasm of breast: Secondary | ICD-10-CM

## 2023-02-24 ENCOUNTER — Ambulatory Visit: Payer: No Typology Code available for payment source | Admitting: Gastroenterology

## 2023-03-02 ENCOUNTER — Ambulatory Visit: Payer: No Typology Code available for payment source | Admitting: Nurse Practitioner

## 2023-04-06 ENCOUNTER — Ambulatory Visit: Payer: No Typology Code available for payment source | Admitting: Nurse Practitioner

## 2023-04-09 ENCOUNTER — Ambulatory Visit: Payer: Self-pay

## 2023-04-09 VITALS — BP 162/98 | Temp 96.8°F

## 2023-04-09 DIAGNOSIS — J301 Allergic rhinitis due to pollen: Secondary | ICD-10-CM

## 2023-04-09 NOTE — Progress Notes (Signed)
 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

## 2023-04-29 ENCOUNTER — Ambulatory Visit: Payer: Self-pay

## 2023-04-29 VITALS — Temp 98.1°F

## 2023-04-29 DIAGNOSIS — J301 Allergic rhinitis due to pollen: Secondary | ICD-10-CM

## 2023-04-29 NOTE — Progress Notes (Signed)
 C/o severe sinus congestion , right ear pressure and persistent cough. Covid test was negative.

## 2023-05-05 ENCOUNTER — Ambulatory Visit: Payer: Self-pay | Admitting: Registered Nurse

## 2023-05-05 ENCOUNTER — Encounter: Payer: Self-pay | Admitting: Registered Nurse

## 2023-05-05 VITALS — BP 146/100 | HR 105 | Temp 98.8°F | Resp 18

## 2023-05-05 DIAGNOSIS — Z0000 Encounter for general adult medical examination without abnormal findings: Secondary | ICD-10-CM

## 2023-05-05 DIAGNOSIS — J301 Allergic rhinitis due to pollen: Secondary | ICD-10-CM

## 2023-05-05 DIAGNOSIS — H6993 Unspecified Eustachian tube disorder, bilateral: Secondary | ICD-10-CM

## 2023-05-05 DIAGNOSIS — R030 Elevated blood-pressure reading, without diagnosis of hypertension: Secondary | ICD-10-CM

## 2023-05-05 MED ORDER — CARBINOXAMINE MALEATE 4 MG PO TABS: 4.0000 mg | ORAL_TABLET | Freq: Two times a day (BID) | ORAL | 1 refills | Status: DC | PRN

## 2023-05-05 NOTE — Progress Notes (Signed)
 Subjective:    Patient ID: Madison Garner, female    DOB: 05/10/1970, 53 y.o.   MRN: [id number]  Madison Garner is a 53 year old caucasian female who presents with a persistent cough and bilateral ear pain.  She has been experiencing a persistent dry cough with clear mucus for the past three weeks, accompanied by intermittent throat clearing. She has tried Allegra 90 mg orally daily and alternated with Claritin, but neither provided relief. Sudafed was used and helped with the cough, but she discontinued it due to sleep disruption.  She also reports bilateral ear pain without any discharge, fever, or chills. She is currently using nasal saline and Flonase nasal spray 1 spray each nostril as needed for symptom relief.       Review of Systems  Constitutional:  Positive for fatigue. Negative for chills, diaphoresis and fever.  HENT:  Positive for congestion, ear pain, postnasal drip, rhinorrhea, sinus pressure, sinus pain and sore throat. Negative for drooling, ear discharge, facial swelling, hearing loss, mouth sores, nosebleeds, sneezing, trouble swallowing and voice change.   Eyes:  Negative for photophobia and visual disturbance.  Respiratory:  Positive for cough. Negative for choking, chest tightness, shortness of breath, wheezing and stridor.   Cardiovascular:  Negative for chest pain.  Gastrointestinal:  Negative for diarrhea, nausea and vomiting.  Genitourinary:  Negative for difficulty urinating.  Musculoskeletal:  Negative for back pain, gait problem, joint swelling, myalgias, neck pain and neck stiffness.  Skin:  Negative for color change and rash.  Allergic/Immunologic: Positive for environmental allergies.  Neurological:  Positive for headaches. Negative for dizziness, tremors, seizures, syncope, facial asymmetry, speech difficulty, weakness, light-headedness and numbness.  Hematological:  Negative for adenopathy. Does not bruise/bleed easily.  Psychiatric/Behavioral:   Positive for sleep disturbance. Negative for agitation and confusion.        Objective:   Physical Exam Vitals and nursing note reviewed.  Constitutional:      General: She is awake. She is not in acute distress.    Appearance: Normal appearance. She is well-developed, well-groomed and overweight. She is ill-appearing. She is not toxic-appearing or diaphoretic.  HENT:     Head: Normocephalic and atraumatic.     Jaw: There is normal jaw occlusion. No trismus.     Salivary Glands: Right salivary gland is not diffusely enlarged or tender. Left salivary gland is not diffusely enlarged or tender.     Right Ear: Hearing, ear canal and external ear normal. No decreased hearing noted. No laceration, drainage, swelling or tenderness. A middle ear effusion is present. There is no impacted cerumen. No foreign body. No mastoid tenderness. No PE tube. No hemotympanum. Tympanic membrane is not injected, scarred, perforated, erythematous, retracted or bulging.     Left Ear: Hearing, ear canal and external ear normal. No decreased hearing noted. No laceration, drainage, swelling or tenderness. A middle ear effusion is present. There is no impacted cerumen. No foreign body. No mastoid tenderness. No PE tube. No hemotympanum. Tympanic membrane is not injected, scarred, perforated, erythematous, retracted or bulging.     Nose: Mucosal edema, congestion and rhinorrhea present. No nasal deformity, septal deviation or laceration. Rhinorrhea is clear.     Right Nostril: No epistaxis.     Left Nostril: No epistaxis.     Right Turbinates: Enlarged and swollen. Not pale.     Left Turbinates: Enlarged and swollen. Not pale.     Right Sinus: No maxillary sinus tenderness or frontal sinus tenderness.     Left Sinus:  No maxillary sinus tenderness or frontal sinus tenderness.     Mouth/Throat:     Lips: Pink. No lesions.     Mouth: Mucous membranes are moist. Mucous membranes are not pale, not dry and not cyanotic. No  lacerations, oral lesions or angioedema.     Dentition: No dental abscesses or gum lesions.     Tongue: No lesions. Tongue does not deviate from midline.     Palate: No mass and lesions.     Pharynx: Uvula midline. Pharyngeal swelling, posterior oropharyngeal erythema and postnasal drip present. No oropharyngeal exudate or uvula swelling.     Tonsils: No tonsillar exudate or tonsillar abscesses.     Comments: Cobblestoning posterior pharynx; bilateral allergic shiners; nasal turbinates edema erythema audible congestion/nasal sniffing and throat clearing in exam room; mild dry cough observed; sinuses not TTP bilateral maxillary and frontal Eyes:     General: Lids are normal. Vision grossly intact. Gaze aligned appropriately. Allergic shiner present. No scleral icterus.       Right eye: No foreign body, discharge or hordeolum.        Left eye: No foreign body, discharge or hordeolum.     Extraocular Movements: Extraocular movements intact.     Right eye: Normal extraocular motion and no nystagmus.     Left eye: Normal extraocular motion and no nystagmus.     Conjunctiva/sclera: Conjunctivae normal.     Right eye: Right conjunctiva is not injected. No chemosis, exudate or hemorrhage.    Left eye: Left conjunctiva is not injected. No chemosis, exudate or hemorrhage.    Pupils: Pupils are equal, round, and reactive to light. Pupils are equal.     Right eye: Pupil is round and reactive.     Left eye: Pupil is round and reactive.  Neck:     Thyroid: No thyroid mass or thyromegaly.     Trachea: Trachea and phonation normal. No tracheal tenderness or tracheal deviation.  Cardiovascular:     Rate and Rhythm: Regular rhythm. Tachycardia present.     Pulses: Normal pulses.          Radial pulses are 2+ on the right side and 2+ on the left side.     Heart sounds: Normal heart sounds, S1 normal and S2 normal. No murmur heard.    No friction rub. No gallop.  Pulmonary:     Effort: Pulmonary effort is  normal. No accessory muscle usage or respiratory distress.     Breath sounds: Normal breath sounds and air entry. No stridor, decreased air movement or transmitted upper airway sounds. No decreased breath sounds, wheezing, rhonchi or rales.     Comments: BBS CTA spoke full sentences without dificulty Chest:     Chest wall: No tenderness.  Abdominal:     General: Abdomen is flat. There is no distension.     Palpations: Abdomen is soft.  Musculoskeletal:        General: No tenderness. Normal range of motion.     Right hand: Normal strength. Normal capillary refill.     Left hand: Normal strength. Normal capillary refill.     Cervical back: Normal range of motion and neck supple. No swelling, edema, deformity, erythema, signs of trauma, lacerations, rigidity, spasms, torticollis, tenderness or crepitus. No pain with movement. Normal range of motion.     Thoracic back: No swelling, edema, deformity, signs of trauma, lacerations, spasms or tenderness. Normal range of motion.     Right hip: Normal.     Left  hip: Normal.     Right knee: Normal.     Left knee: Normal.     Right lower leg: No edema.     Left lower leg: No edema.  Lymphadenopathy:     Head:     Right side of head: No submental, submandibular, tonsillar, preauricular, posterior auricular or occipital adenopathy.     Left side of head: No submental, submandibular, tonsillar, preauricular, posterior auricular or occipital adenopathy.     Cervical: No cervical adenopathy.     Right cervical: No superficial, deep or posterior cervical adenopathy.    Left cervical: No superficial, deep or posterior cervical adenopathy.  Skin:    General: Skin is warm and dry.     Capillary Refill: Capillary refill takes less than 2 seconds.     Coloration: Skin is not ashen, cyanotic, jaundiced, mottled, pale or sallow.     Findings: No abrasion, abscess, acne, bruising, burn, ecchymosis, erythema, signs of injury, laceration, lesion, petechiae, rash  or wound.     Nails: There is no clubbing.     Comments: Face/neck/hands visually inspected  Neurological:     General: No focal deficit present.     Mental Status: She is alert and oriented to person, place, and time. Mental status is at baseline. She is not disoriented.     GCS: GCS eye subscore is 4. GCS verbal subscore is 5. GCS motor subscore is 6.     Cranial Nerves: Cranial nerves 2-12 are intact. No cranial nerve deficit, dysarthria or facial asymmetry.     Sensory: Sensation is intact. No sensory deficit.     Motor: Motor function is intact. No weakness, tremor, atrophy, abnormal muscle tone or seizure activity.     Coordination: Coordination is intact. Coordination normal.     Gait: Gait is intact. Gait normal.     Comments: In/out of chair without difficulty; gait sure and steady in clinic; bilateral hand grasp equal 5/5  Psychiatric:        Attention and Perception: Attention and perception normal.        Mood and Affect: Mood and affect normal.        Speech: Speech normal.        Behavior: Behavior normal. Behavior is cooperative.        Thought Content: Thought content normal.        Cognition and Memory: Cognition and memory normal.        Judgment: Judgment normal.           Assessment & Plan:  A-seaonal allergic rhinitis due to pollen, eustachian tube dysfunction bilateral,  elevated blood pressure reading , preventative health care  P-Allergic Rhinitis Chronic allergic rhinitis with ineffective response to fexofenadine and loratadine. Pseudoephedrine alleviated cough but caused insomnia. Cobblestoning in the posterior pharynx indicates postnasal drip. - Prescribed carbonaxime malonate 4 mg PO BID PRN for rhinitis. Increase to QID or 8 mg BID if needed wearing off too soon electronic Rx sent to pharmacy of choice #60 [facility]0  Stop allegra/claritin.. - Continue nasal saline and fluticasone propionate as needed.  Patient may use normal saline nasal spray 2 sprays each  nostril q2h wa as needed. flonase 50mcg 1 spray each nostril BID  Use nasal saline first  Patient denied personal or family history of ENT cancer. Avoid triggers if possible.  Shower prior to bedtime if exposed to triggers.  If allergic dust/dust mites recommend mattress/pillow covers/encasements; washing linens, vacuuming, sweeping, dusting weekly.  Call or return to  clinic as needed if these symptoms worsen or fail to improve as anticipated. Discussed sp02 slightly decreased bronchial irritation due to post nasal drip  Patient verbalized understanding of instructions, agreed with plan of care and had no further questions at this time.  P2:  Avoidance and hand washing.   Only had 1 cup coffee this am.  Avoid further caffeine today. ER if chest pain, worst headache of life, dyspnea or visual changes for re-evaluation.  Will see RN Thersia Flax next week for repeat BP check.  Exitcare handout preventing hypertension and DASH diet.  Patient verbalized understanding information/instructions, agreed with plan of care and had no further questions at this time.   Be Well 2026 fasting labs due ordered female executive panel and Hgba1c for patient to schedule with RN Thersia Flax once this acute flare up resolved anticipate early May.  Patient verbalized understanding and had no further questions at this time.   No evidence of invasive bacterial infection, non toxic and well hydrated.  I do not see where any further testing or imaging is necessary at this time.   I will suggest supportive care, rest, good hygiene and encourage the patient to take adequate fluids.  The patient is to return to clinic or EMERGENCY ROOM if symptoms worsen or change significantly e.g. ear pain, fever, purulent discharge from ears or bleeding.  Exitcare handout on eustachian tube dysfunction.  Discussed with patient post nasal drip irritates throat/causes swelling blocks eustachian tubes from draining and fluid fills up middle ear.  Bacteria/viruses can  grow in fluid and with moving head tube compressed and increases pressure in tube/ear worsening pain.  Studies show will take 30 days for fluid to resolve after post nasal drip controlled with nasal steroid/antihistamine. Antibiotics and steroids do not speed up fluid removal.  Patient verbalized agreement and understanding of treatment plan and had no further questions at this time.

## 2023-05-05 NOTE — Patient Instructions (Addendum)
 How to Perform a Sinus Rinse A sinus rinse is a home treatment that is used to rinse your sinuses with a germ-free (sterile) mixture of salt and water (saline solution). Sinuses are air-filled spaces in your skull that are behind the bones of your face and forehead. They open into your nasal cavity. A sinus rinse can help to clear mucus, dirt, dust, or pollen from your nasal cavity. You may do a sinus rinse when you have a cold, a virus, nasal allergy symptoms, a sinus infection, or stuffiness in your nose or sinuses. What are the risks? A sinus rinse is generally safe and effective. However, there are a few risks, which include: A burning sensation in your sinuses. This may happen if you do not make the saline solution as directed. Be sure to follow all directions when making the saline solution. Nasal irritation. Infection. This may be from unclean supplies or from contaminated water. Infection from contaminated water is rare, but possible. Do not do a sinus rinse if you have had ear or nasal surgery, ear infection, or plugged ears, unless recommended by your health care provider. Supplies needed: Saline solution or powder. Distilled or sterile water to mix with saline powder. You may use boiled and cooled tap water. Boil tap water for 5 minutes; cool until it is lukewarm. Use within 24 hours. Do not use regular tap water to mix with the saline solution. Neti pot or nasal rinse bottle. These supplies release the saline solution into your nose and through your sinuses. Neti pots and nasal rinse bottles can be purchased at [employer], a health food store, or online. How to perform a sinus rinse  Wash your hands with soap and water for at least 20 seconds. If soap and water are not available, use hand sanitizer. Wash your device according to the directions that came with the product and then dry it. Use the solution that comes with your product or one that is sold separately in stores.  Follow the mixing directions on the package to mix with sterile or distilled water. Fill the device with the amount of saline solution noted in the device instructions. Stand by a sink and tilt your head sideways over the sink. Place the spout of the device in your upper nostril (the one closer to the ceiling). Gently pour or squeeze the saline solution into your nasal cavity. The liquid should drain out from the lower nostril if you are not too congested. While rinsing, breathe through your open mouth. Gently blow your nose to clear any mucus and rinse solution. Blowing too hard may cause ear pain. Turn your head in the other direction and repeat in your other nostril. Clean and rinse your device with clean water and then air-dry it. Talk with your health care provider or pharmacist if you have questions about how to do a sinus rinse. Summary A sinus rinse is a home treatment that is used to rinse your sinuses with a sterile mixture of salt and water (saline solution). You may do a sinus rinse when you have a cold, a virus, nasal allergy symptoms, a sinus infection, or stuffiness in your nose or sinuses. A sinus rinse is generally safe and effective. Follow all instructions carefully. This information is not intended to replace advice given to you by your health care provider. Make sure you discuss any questions you have with your health care provider. Document Revised: 06/25/2020 Document Reviewed: 06/25/2020 Elsevier Patient Education  2024 Else[employer] Inc.Sinus Pain  Sinus pain may occur when your sinuses become clogged or swollen. Sinuses are air-filled spaces in your skull that are behind the bones of your face and forehead. Sinus pain can range from mild to severe. What are the causes? Sinus pain can result from various conditions that affect the sinuses. Common causes include: Colds. Sinus infections. Allergies. What are the signs or symptoms? The main symptom of this condition is pain  or pressure in your face, forehead, ears, or upper teeth. People who have sinus pain often have other symptoms, such as: Congested or runny nose. Fever. Inability to smell. Headache. Weather changes can make symptoms worse. How is this diagnosed? Your health care provider will diagnose this condition based on your symptoms and a physical exam. If you have pain that keeps coming back or does not go away, your health care provider may recommend more testing. This may include: Imaging tests, such as a CT scan or MRI, to check for problems with your sinuses. Examination of your sinuses using a thin tool with a camera that is inserted through your nose (endoscopy). How is this treated? Treatment for this condition depends on the cause. Sinus pain that is caused by a sinus infection may be treated with antibiotic medicine. Sinus pain that is caused by congestion may be helped by rinsing out (flushing) the nose and sinuses with saline solution. Sinus pain that is caused by allergies may be helped by allergy medicines (antihistamines) and medicated nasal sprays. Sinus surgery may be needed in some cases if other treatments do not help. Follow these instructions at home: General instructions If directed: Apply a warm, moist washcloth to your face to help relieve pain. Use a nasal saline wash. Follow the directions on the bottle or box. Hydrate and humidify Drink enough water to keep your urine clear or pale yellow. Staying hydrated will help to thin your mucus. Use a humidifier if your home is dry. Inhale steam for 10-15 minutes, 3-4 times a day or as told by your health care provider. You can do this in the bathroom while a hot shower is running. Limit your exposure to cool or dry air. Medicines  Take over-the-counter and prescription medicines only as told by your health care provider. If you were prescribed an antibiotic medicine, take it as told by your health care provider. Do not stop  taking the antibiotic even if you start to feel better. If you have congestion, use a nasal spray to help lessen pressure. Contact a health care provider if: You have sinus pain more than one time a week. You have sensitivity to light or sound. You develop a fever. You feel nauseous or you vomit. Your sinus pain or headache does not get better with treatment. Get help right away if: You have vision problems. You have sudden, severe pain in your face or head. You have a seizure. You are confused. You have a stiff neck. Summary Sinus pain occurs when your sinuses become clogged or swollen. Sinus pain can result from various conditions that affect the sinuses, such as a cold, a sinus infection, or an allergy. Treatment for this condition depends on the cause. It may include medicine, such as antibiotics or antihistamines. This information is not intended to replace advice given to you by your health care provider. Make sure you discuss any questions you have with your health care provider. Document Revised: 12/09/2020 Document Reviewed: 12/09/2020 Elsevier Patient Education  2024 Elsevier Inc.Cough, Adult Coughing is a reflex that clears your  throat and airways (respiratory system). It helps heal and protect your lungs. It is normal to cough from time to time. A cough that happens with other symptoms or that lasts a long time may be a sign of a condition that needs treatment. A short-term (acute) cough may only last 2-3 weeks. A long-term (chronic) cough may last 8 or more weeks. Coughing is often caused by: Diseases, such as: An infection of the respiratory system. Asthma or other heart or lung diseases. Gastroesophageal reflux. This is when acid comes back up from the stomach. Breathing in things that irritate your lungs. Allergies. Postnasal drip. This is when mucus runs down the back of your throat. Smoking. Some medicines. Follow these instructions at home: Medicines Take  over-the-counter and prescription medicines only as told by your health care provider. Talk with your provider before you take cough medicine (cough suppressants). Eating and drinking Do not drink alcohol. Avoid caffeine. Drink enough fluid to keep your pee (urine) pale yellow. Lifestyle Avoid cigarette smoke. Do not use any products that contain nicotine or tobacco. These products include cigarettes, chewing tobacco, and vaping devices, such as e-cigarettes. If you need help quitting, ask your provider. Avoid things that make you cough. These may include perfumes, candles, cleaning products, or campfire smoke. General instructions  Watch for any changes to your cough. Tell your provider about them. Always cover your mouth when you cough. If the air is dry in your bedroom or home, use a cool mist vaporizer or humidifier. If your cough is worse at night, try to sleep in a semi-upright position. Rest as needed. Contact a health care provider if: You have new symptoms, or your symptoms get worse. You cough up pus. You have a fever that does not go away or a cough that does not get better after 2-3 weeks. You cannot control your cough with medicine, and you are losing sleep. You have pain that gets worse or is not helped with medicine. You lose weight for no clear reason. You have night sweats. Get help right away if: You cough up blood. You have trouble breathing. Your heart is beating very fast. These symptoms may be an emergency. Get help right away. Call 911. Do not wait to see if the symptoms will go away. Do not drive yourself to the hospital. This information is not intended to replace advice given to you by your health care provider. Make sure you discuss any questions you have with your health care provider. Document Revised: 09/06/2021 Document Reviewed: 09/06/2021 Elsevier Patient Education  2024 Elsevier IDASH Eating Plan DASH stands for Dietary Approaches to Stop  Hypertension. The DASH eating plan is a healthy eating plan that has been shown to: Lower high blood pressure (hypertension). Reduce your risk for type 2 diabetes, heart disease, and stroke. Help with weight loss. What are tips for following this plan? Reading food labels Check food labels for the amount of salt (sodium) per serving. Choose foods with less than 5 percent of the Daily Value (DV) of sodium. In general, foods with less than 300 milligrams (mg) of sodium per serving fit into this eating plan. To find whole grains, look for the word "whole" as the first word in the ingredient list. Shopping Buy products labeled as "low-sodium" or "no salt added." Buy fresh foods. Avoid canned foods and pre-made or frozen meals. Cooking Try not to add salt when you cook. Use salt-free seasonings or herbs instead of table salt or sea salt. Check with  your health care provider or pharmacist before using salt substitutes. Do not fry foods. Cook foods in healthy ways, such as baking, boiling, grilling, roasting, or broiling. Cook using oils that are good for your heart. These include olive, canola, avocado, soybean, and sunflower oil. Meal planning  Eat a balanced diet. This should include: 4 or more servings of fruits and 4 or more servings of vegetables each day. Try to fill half of your plate with fruits and vegetables. 6-8 servings of whole grains each day. 6 or less servings of lean meat, poultry, or fish each day. 1 oz is 1 serving. A 3 oz (85 g) serving of meat is about the same size as the palm of your hand. One egg is 1 oz (28 g). 2-3 servings of low-fat dairy each day. One serving is 1 cup (237 mL). 1 serving of nuts, seeds, or beans 5 times each week. 2-3 servings of heart-healthy fats. Healthy fats called omega-3 fatty acids are found in foods such as walnuts, flaxseeds, fortified milks, and eggs. These fats are also found in cold-water fish, such as sardines, salmon, and mackerel. Limit  how much you eat of: Canned or prepackaged foods. Food that is high in trans fat, such as fried foods. Food that is high in saturated fat, such as fatty meat. Desserts and other sweets, sugary drinks, and other foods with added sugar. Full-fat dairy products. Do not salt foods before eating. Do not eat more than 4 egg yolks a week. Try to eat at least 2 vegetarian meals a week. Eat more home-cooked food and less restaurant, buffet, and fast food. Lifestyle When eating at a restaurant, ask if your food can be made with less salt or no salt. If you drink alcohol: Limit how much you have to: 0-1 drink a day if you are female. 0-2 drinks a day if you are female. Know how much alcohol is in your drink. In the [place] one drink is one 12 oz bottle of beer (355 mL), one 5 oz glass of wine (148 mL), or one 1 oz glass of hard liquor (44 mL). General information Avoid eating more than 2,300 mg of salt a day. If you have hypertension, you may need to reduce your sodium intake to 1,500 mg a day. Work with your provider to stay at a healthy body weight or lose weight. Ask what the best weight range is for you. On most days of the week, get at least 30 minutes of exercise that causes your heart to beat faster. This may include walking, swimming, or biking. Work with your provider or dietitian to adjust your eating plan to meet your specific calorie needs. What foods should I eat? Fruits All fresh, dried, or frozen fruit. Canned fruits that are in their natural juice and do not have sugar added to them. Vegetables Fresh or frozen vegetables that are raw, steamed, roasted, or grilled. Low-sodium or reduced-sodium tomato and vegetable juice. Low-sodium or reduced-sodium tomato sauce and tomato paste. Low-sodium or reduced-sodium canned vegetables. Grains Whole-grain or whole-wheat bread. Whole-grain or whole-wheat pasta. Brown rice. Orpah Cobb. Bulgur. Whole-grain and low-sodium cereals. Pita bread.  Low-fat, low-sodium crackers. Whole-wheat flour tortillas. Meats and other proteins Skinless chicken or [place]. Ground chicken or [place]. Pork with fat trimmed off. Fish and seafood. Egg whites. Dried beans, peas, or lentils. Unsalted nuts, nut butters, and seeds. Unsalted canned beans. Lean cuts of beef with fat trimmed off. Low-sodium, lean precooked or cured meat, such as sausages or  meat loaves. Dairy Low-fat (1%) or fat-free (skim) milk. Reduced-fat, low-fat, or fat-free cheeses. Nonfat, low-sodium ricotta or cottage cheese. Low-fat or nonfat yogurt. Low-fat, low-sodium cheese. Fats and oils Soft margarine without trans fats. Vegetable oil. Reduced-fat, low-fat, or light mayonnaise and salad dressings (reduced-sodium). Canola, safflower, olive, avocado, soybean, and sunflower oils. Avocado. Seasonings and condiments Herbs. Spices. Seasoning mixes without salt. Other foods Unsalted popcorn and pretzels. Fat-free sweets. The items listed above may not be all the foods and drinks you can have. Talk to a dietitian to learn more. What foods should I avoid? Fruits Canned fruit in a light or heavy syrup. Fried fruit. Fruit in cream or butter sauce. Vegetables Creamed or fried vegetables. Vegetables in a cheese sauce. Regular canned vegetables that are not marked as low-sodium or reduced-sodium. Regular canned tomato sauce and paste that are not marked as low-sodium or reduced-sodium. Regular tomato and vegetable juices that are not marked as low-sodium or reduced-sodium. Vanessa General. Olives. Grains Baked goods made with fat, such as croissants, muffins, or some breads. Dry pasta or rice meal packs. Meats and other proteins Fatty cuts of meat. Ribs. Fried meat. Helene Loader. Bologna, salami, and other precooked or cured meats, such as sausages or meat loaves, that are not lean and low in sodium. Fat from the back of a pig (fatback). Bratwurst. Salted nuts and seeds. Canned beans with added salt. Canned or  smoked fish. Whole eggs or egg yolks. Chicken or [place] with skin. Dairy Whole or 2% milk, cream, and half-and-half. Whole or full-fat cream cheese. Whole-fat or sweetened yogurt. Full-fat cheese. Nondairy creamers. Whipped toppings. Processed cheese and cheese spreads. Fats and oils Butter. Stick margarine. Lard. Shortening. Ghee. Bacon fat. Tropical oils, such as coconut, palm kernel, or palm oil. Seasonings and condiments Onion salt, garlic salt, seasoned salt, table salt, and sea salt. Worcestershire sauce. Tartar sauce. Barbecue sauce. Teriyaki sauce. Soy sauce, including reduced-sodium soy sauce. Steak sauce. Canned and packaged gravies. Fish sauce. Oyster sauce. Cocktail sauce. Store-bought horseradish. Ketchup. Mustard. Meat flavorings and tenderizers. Bouillon cubes. Hot sauces. Pre-made or packaged marinades. Pre-made or packaged taco seasonings. Relishes. Regular salad dressings. Other foods Salted popcorn and pretzels. The items listed above may not be all the foods and drinks you should avoid. Talk to a dietitian to learn more. Where to find more information National Heart, Lung, and Blood Institute (NHLBI): BuffaloDryCleaner.gl American Heart Association (AHA): heart.org Academy of Nutrition and Dietetics: eatright.org National Kidney Foundation (NKF): kidney.org This information is not intended to replace advice given to you by your health care provider. Make sure you discuss any questions you have with your health care provider. Document Revised: 01/23/2022 Document Reviewed: 01/23/2022 Elsevier Patient Education  2024 Elsevier Inc.Preventing Hypertension Hypertension, also called high blood pressure, is when the force of blood pumping through the arteries is too strong. Arteries are blood vessels that carry blood from the heart throughout the body. Often, hypertension does not cause symptoms until blood pressure is very high. It is important to have your blood pressure checked  regularly. Diet and lifestyle changes can help you prevent hypertension, and they may make you feel better overall and improve your quality of life. If you already have hypertension, you may control it with diet and lifestyle changes, as well as with medicine. How can this condition affect me? Over time, hypertension can damage the arteries and decrease blood flow to important parts of the body, including the brain, heart, and kidneys. By keeping your blood pressure in a healthy  range, you can help prevent complications like heart attack, heart failure, stroke, kidney failure, and vascular dementia. What can increase my risk? An unhealthy diet and a lack of physical activity can make you more likely to develop high blood pressure. Some other risk factors include: Age. The risk increases with age. Having family members who have had high blood pressure. Having certain health conditions, such as thyroid problems. Being overweight or obese. Drinking too much alcohol or caffeine. Having too much fat, sugar, calories, or salt (sodium) in your diet. Smoking or using illegal drugs. Taking certain medicines, such as antidepressants, decongestants, birth control pills, and NSAIDs, such as ibuprofen. What actions can I take to prevent or manage this condition? Work with your health care provider to make a hypertension prevention plan that works for you. You may be referred for counseling on a healthy diet and physical activity. Follow your plan and keep all follow-up visits. Diet changes Maintain a healthy diet. This includes: Eating less salt (sodium). Ask your health care provider how much sodium is safe for you to have. The general recommendation is to have less than 1 tsp (2,300 mg) of sodium a day. Do not add salt to your food. Choose low-sodium options when grocery shopping and eating out. Limiting fats in your diet. You can do this by eating low-fat or fat-free dairy products and by eating less red  meat. Eating more fruits, vegetables, and whole grains. Make a goal to eat: 1-2 cups of fresh fruits and vegetables each day. 3-4 servings of whole grains each day. Avoiding foods and beverages that have added sugars. Eating fish that contain healthy fats (omega-3 fatty acids), such as mackerel or salmon. If you need help putting together a healthy eating plan, try the DASH diet. This diet is high in fruits, vegetables, and whole grains. It is low in sodium, red meat, and added sugars. DASH stands for Dietary Approaches to Stop Hypertension. Lifestyle changes  Lose weight if you are overweight. Losing just 3-5% of your body weight can help prevent or control hypertension. For example, if your present weight is 200 lb (91 kg), a loss of 3-5% of your weight means losing 6-10 lb (2.7-4.5 kg). Ask your health care provider to help you with a diet and exercise plan to safely lose weight. Get enough exercise. Do at least 150 minutes of moderate-intensity exercise each week. You could do this in short exercise sessions several times a day, or you could do longer exercise sessions a few times a week. For example, you could take a brisk 10-minute walk or bike ride, 3 times a day, for 5 days a week. Find ways to reduce stress, such as exercising, meditating, listening to music, or taking a yoga class. If you need help reducing stress, ask your health care provider. Do not use any products that contain nicotine or tobacco. These products include cigarettes, chewing tobacco, and vaping devices, such as e-cigarettes. Chemicals in tobacco and nicotine products raise your blood pressure each time you use them. If you need help quitting, ask your health care provider. Learn how to check your blood pressure at home. Make sure that you know your personal target blood pressure, as told by your health care provider. Try to sleep 7-9 hours per night. Alcohol use Do not drink alcohol if: Your health care provider tells  you not to drink. You are pregnant, may be pregnant, or are planning to become pregnant. If you drink alcohol: Limit how much you have  to: 0-1 drink a day for women. 0-2 drinks a day for men. Know how much alcohol is in your drink. In the [place] one drink equals one 12 oz bottle of beer (355 mL), one 5 oz glass of wine (148 mL), or one 1 oz glass of hard liquor (44 mL). Medicines In addition to diet and lifestyle changes, your health care provider may recommend medicines to help lower your blood pressure. In general: You may need to try a few different medicines to find what works best for you. You may need to take more than one medicine. Take over-the-counter and prescription medicines only as told by your health care provider. Questions to ask your health care provider What is my blood pressure goal? How can I lower my risk for high blood pressure? How should I monitor my blood pressure at home? Where to find support Your health care provider can help you prevent hypertension and help you keep your blood pressure at a healthy level. Your local hospital or your community may also provide support services and prevention programs. The American Heart Association offers an online support network at supportnetwork.heart.org Where to find more information Learn more about hypertension from: National Heart, Lung, and Blood Institute: PopSteam.is Centers for Disease Control and Prevention: FootballExhibition.com.br American Academy of Family Physicians: familydoctor.org Learn more about the DASH diet from: National Heart, Lung, and Blood Institute: PopSteam.is Contact a health care provider if: You think you are having a reaction to medicines you have taken. You have recurrent headaches or feel dizzy. You have swelling in your ankles. You have trouble with your vision. Get help right away if: You have sudden, severe chest, back, or abdominal pain or discomfort. You have shortness of  breath. You have a sudden, severe headache. These symptoms may be an emergency. Get help right away. Call 911. Do not wait to see if the symptoms will go away. Do not drive yourself to the hospital. Summary Hypertension often does not cause any symptoms until blood pressure is very high. It is important to get your blood pressure checked regularly. Diet and lifestyle changes are important steps in preventing hypertension. By keeping your blood pressure in a healthy range, you may prevent complications like heart attack, heart failure, stroke, and kidney failure. Work with your health care provider to make a hypertension prevention plan that works for you. This information is not intended to replace advice given to you by your health care provider. Make sure you discuss any questions you have with your health care provider. Document Revised: 10/25/2020 Document Reviewed: 10/25/2020 Elsevier Patient Education  2024 [employer]Eustachian Tube Dysfunction  Eustachian tube dysfunction refers to a condition in which a blockage develops in the narrow passage that connects the middle ear to the back of the nose (eustachian tube). The eustachian tube regulates air pressure in the middle ear by letting air move between the ear and nose. It also helps to drain fluid from the middle ear space. Eustachian tube dysfunction can affect one or both ears. When the eustachian tube does not function properly, air pressure, fluid, or both can build up in the middle ear. What are the causes? This condition occurs when the eustachian tube becomes blocked or cannot open normally. Common causes of this condition include: Ear infections. Colds and other infections that affect the nose, mouth, and throat (upper respiratory tract). Allergies. Irritation from cigarette smoke. Irritation from stomach acid coming up into the esophagus (gastroesophageal reflux). The esophagus is the part of  the body that moves food from  the mouth to the stomach. Sudden changes in air pressure, such as from descending in an airplane or scuba diving. Abnormal growths in the nose or throat, such as: Growths that line the nose (nasal polyps). Abnormal growth of cells (tumors). Enlarged tissue at the back of the throat (adenoids). What increases the risk? You are more likely to develop this condition if: You smoke. You are overweight. You are a child who has: Certain birth defects of the mouth, such as cleft palate. Large tonsils or adenoids. What are the signs or symptoms? Common symptoms of this condition include: A feeling of fullness in the ear. Ear pain. Clicking or popping noises in the ear. Ringing in the ear (tinnitus). Hearing loss. Loss of balance. Dizziness. Symptoms may get worse when the air pressure around you changes, such as when you travel to an area of high elevation, fly on an airplane, or go scuba diving. How is this diagnosed? This condition may be diagnosed based on: Your symptoms. A physical exam of your ears, nose, and throat. Tests, such as those that measure: The movement of your eardrum. Your hearing (audiometry). How is this treated? Treatment depends on the cause and severity of your condition. In mild cases, you may relieve your symptoms by moving air into your ears. This is called "popping the ears." In more severe cases, or if you have symptoms of fluid in your ears, treatment may include: Medicines to relieve congestion (decongestants). Medicines that treat allergies (antihistamines). Nasal sprays or ear drops that contain medicines that reduce swelling (steroids). A procedure to drain the fluid in your eardrum. In this procedure, a small tube may be placed in the eardrum to: Drain the fluid. Restore the air in the middle ear space. A procedure to insert a balloon device through the nose to inflate the opening of the eustachian tube (balloon dilation). Follow these instructions at  home: Lifestyle Do not do any of the following until your health care provider approves: Travel to high altitudes. Fly in airplanes. Work in a Estate agent or room. Scuba dive. Do not use any products that contain nicotine or tobacco. These products include cigarettes, chewing tobacco, and vaping devices, such as e-cigarettes. If you need help quitting, ask your health care provider. Keep your ears dry. Wear fitted earplugs during showering and bathing. Dry your ears completely after. General instructions Take over-the-counter and prescription medicines only as told by your health care provider. Use techniques to help pop your ears as recommended by your health care provider. These may include: Chewing gum. Yawning. Frequent, forceful swallowing. Closing your mouth, holding your nose closed, and gently blowing as if you are trying to blow air out of your nose. Keep all follow-up visits. This is important. Contact a health care provider if: Your symptoms do not go away after treatment. Your symptoms come back after treatment. You are unable to pop your ears. You have: A fever. Pain in your ear. Pain in your head or neck. Fluid draining from your ear. Your hearing suddenly changes. You become very dizzy. You lose your balance. Get help right away if: You have a sudden, severe increase in any of your symptoms. Summary Eustachian tube dysfunction refers to a condition in which a blockage develops in the eustachian tube. It can be caused by ear infections, allergies, inhaled irritants, or abnormal growths in the nose or throat. Symptoms may include ear pain or fullness, hearing loss, or ringing in the ears.  Mild cases are treated with techniques to unblock the ears, such as yawning or chewing gum. More severe cases are treated with medicines or procedures. This information is not intended to replace advice given to you by your health care provider. Make sure you discuss any  questions you have with your health care provider. Document Revised: 03/19/2020 Document Reviewed: 03/19/2020 Elsevier Patient Education  2024 [employer].

## 2023-05-11 ENCOUNTER — Ambulatory Visit: Payer: Self-pay

## 2023-05-11 VITALS — BP 165/97 | Ht 60.5 in | Wt 187.0 lb

## 2023-05-11 DIAGNOSIS — Z0000 Encounter for general adult medical examination without abnormal findings: Secondary | ICD-10-CM

## 2023-05-11 NOTE — Progress Notes (Signed)
 Be well paperwork and labs will be done tomorrow.

## 2023-05-12 ENCOUNTER — Other Ambulatory Visit: Payer: Self-pay

## 2023-05-12 VITALS — BP 140/98

## 2023-05-12 DIAGNOSIS — Z0000 Encounter for general adult medical examination without abnormal findings: Secondary | ICD-10-CM

## 2023-05-12 NOTE — Progress Notes (Signed)
 Be Well labs and BP check

## 2023-05-13 ENCOUNTER — Encounter: Payer: Self-pay | Admitting: Registered Nurse

## 2023-05-13 DIAGNOSIS — Z0000 Encounter for general adult medical examination without abnormal findings: Secondary | ICD-10-CM

## 2023-05-13 LAB — CMP12+LP+TP+TSH+6AC+CBC/D/PLT
ALT: 16 IU/L (ref 0–32)
AST: 19 IU/L (ref 0–40)
Albumin: 4.4 g/dL (ref 3.8–4.9)
Alkaline Phosphatase: 93 IU/L (ref 44–121)
BUN/Creatinine Ratio: 17 (ref 9–23)
BUN: 14 mg/dL (ref 6–24)
Basophils Absolute: 0.1 10*3/uL (ref 0.0–0.2)
Basos: 1 %
Bilirubin Total: 0.4 mg/dL (ref 0.0–1.2)
Calcium: 10.2 mg/dL (ref 8.7–10.2)
Chloride: 101 mmol/L (ref 96–106)
Chol/HDL Ratio: 3.3 ratio (ref 0.0–4.4)
Cholesterol, Total: 239 mg/dL — ABNORMAL HIGH (ref 100–199)
Creatinine, Ser: 0.81 mg/dL (ref 0.57–1.00)
EOS (ABSOLUTE): 0.2 10*3/uL (ref 0.0–0.4)
Eos: 2 %
Estimated CHD Risk: <0.5 times avg. (ref 0.0–1.0)
Free Thyroxine Index: 1.8 (ref 1.2–4.9)
GGT: 13 IU/L (ref 0–60)
Globulin, Total: 2.7 g/dL (ref 1.5–4.5)
Glucose: 88 mg/dL (ref 70–99)
HDL: 72 mg/dL (ref >39)
Hematocrit: 42.2 % (ref 34.0–46.6)
Hemoglobin: 14.2 g/dL (ref 11.1–15.9)
Immature Grans (Abs): 0 10*3/uL (ref 0.0–0.1)
Immature Granulocytes: 0 %
Iron: 121 ug/dL (ref 27–159)
LDH: 196 IU/L (ref 119–226)
LDL Chol Calc (NIH): 152 mg/dL — ABNORMAL HIGH (ref 0–99)
Lymphocytes Absolute: 1.9 10*3/uL (ref 0.7–3.1)
Lymphs: 27 %
MCH: 30.5 pg (ref 26.6–33.0)
MCHC: 33.6 g/dL (ref 31.5–35.7)
MCV: 91 fL (ref 79–97)
Monocytes Absolute: 0.5 10*3/uL (ref 0.1–0.9)
Monocytes: 8 %
Neutrophils Absolute: 4.5 10*3/uL (ref 1.4–7.0)
Neutrophils: 62 %
Phosphorus: 3.8 mg/dL (ref 3.0–4.3)
Platelets: 347 10*3/uL (ref 150–450)
Potassium: 4.7 mmol/L (ref 3.5–5.2)
RBC: 4.65 x10E6/uL (ref 3.77–5.28)
RDW: 11.9 % (ref 11.7–15.4)
Sodium: 139 mmol/L (ref 134–144)
T3 Uptake Ratio: 25 % (ref 24–39)
T4, Total: 7 ug/dL (ref 4.5–12.0)
TSH: 1.9 u[IU]/mL (ref 0.450–4.500)
Total Protein: 7.1 g/dL (ref 6.0–8.5)
Triglycerides: 87 mg/dL (ref 0–149)
Uric Acid: 4.9 mg/dL (ref 3.0–7.2)
VLDL Cholesterol Cal: 15 mg/dL (ref 5–40)
WBC: 7.2 10*3/uL (ref 3.4–10.8)
eGFR: 88 mL/min/{1.73_m2} (ref >59)

## 2023-05-13 LAB — HEMOGLOBIN A1C
Est. average glucose Bld gHb Est-mCnc: 103 mg/dL
Hgb A1c MFr Bld: 5.2 % (ref 4.8–5.6)

## 2023-05-20 ENCOUNTER — Encounter: Payer: Self-pay | Admitting: Nurse Practitioner

## 2023-05-20 ENCOUNTER — Ambulatory Visit (INDEPENDENT_AMBULATORY_CARE_PROVIDER_SITE_OTHER): Admitting: Nurse Practitioner

## 2023-05-20 VITALS — BP 126/84 | HR 80 | Ht 59.5 in | Wt 187.0 lb

## 2023-05-20 DIAGNOSIS — Z1331 Encounter for screening for depression: Secondary | ICD-10-CM | POA: Diagnosis not present

## 2023-05-20 DIAGNOSIS — Z01419 Encounter for gynecological examination (general) (routine) without abnormal findings: Secondary | ICD-10-CM | POA: Diagnosis not present

## 2023-05-20 DIAGNOSIS — N898 Other specified noninflammatory disorders of vagina: Secondary | ICD-10-CM

## 2023-05-20 DIAGNOSIS — Z79890 Hormone replacement therapy (postmenopausal): Secondary | ICD-10-CM

## 2023-05-20 DIAGNOSIS — N760 Acute vaginitis: Secondary | ICD-10-CM | POA: Diagnosis not present

## 2023-05-20 LAB — WET PREP FOR TRICH, YEAST, CLUE

## 2023-05-20 MED ORDER — METRONIDAZOLE 500 MG PO TABS: 500.0000 mg | ORAL_TABLET | Freq: Two times a day (BID) | ORAL | 0 refills | Status: AC

## 2023-05-20 MED ORDER — ESTRADIOL 0.025 MG/24HR TD PTWK: 0.0250 mg | MEDICATED_PATCH | TRANSDERMAL | 1 refills | Status: DC

## 2023-05-20 MED ORDER — PROGESTERONE MICRONIZED 100 MG PO CAPS: 100.0000 mg | ORAL_CAPSULE | Freq: Every evening | ORAL | 0 refills | Status: DC

## 2023-05-20 NOTE — Progress Notes (Signed)
 Madison Garner 09-06-1970 [id number]   History:  53 y.o. G2P2002 presents for annual exam. Postmenopausal - no HRT. Complains of menopausal symptoms - hot flashes, night sweats, trouble sleeping, weight gain, hair thinning, increased hair growth, brain fog, body odor. Symptoms have increasingly worsened over the past year. Taking Ashwaganda, D3, mag, tart cherry, hair skin and nails vitamin. History of abnormal pap smears, negative colposcopies x2 while pregnant with daughter 20 years ago. Normal paps since.   Gynecologic History No LMP recorded. Patient is perimenopausal.   Contraception/Family planning: vasectomy Sexually active: Yes  Health Maintenance Last Pap: 09/19/2020. Results were: Normal neg HPV Last mammogram: 02/18/2023. Results were: Normal Last colonoscopy: + Cologuard 08/14/2022 Last Dexa: Not indicated     05/20/2023    2:00 PM  Depression screen PHQ 2/9  Decreased Interest 0  Down, Depressed, Hopeless 0  PHQ - 2 Score 0     Past medical history, past surgical history, family history and social history were all reviewed and documented in the EPIC chart. Boyfriend of 24 years, father of children. 2 daughters ages 55 and 65. Works for [employer].   ROS:  A ROS was performed and pertinent positives and negatives are included.  Exam:  Vitals:   05/20/23 1356  BP: 126/84  Pulse: 80  SpO2: 94%  Weight: 187 lb (84.8 kg)  Height: 4' 11.5" (1.511 m)     Body mass index is 37.14 kg/m.  General appearance:  Normal Thyroid:  Symmetrical, normal in size, without palpable masses or nodularity. Respiratory  Auscultation:  Clear without wheezing or rhonchi Cardiovascular  Auscultation:  Regular rate, without rubs, murmurs or gallops  Edema/varicosities:  Not grossly evident Abdominal  Soft,nontender, without masses, guarding or rebound.  Liver/spleen:  No organomegaly noted  Hernia:  None appreciated  Skin  Inspection:  Grossly normal Breasts: Examined  lying and sitting.   Right: Without masses, retractions, nipple discharge or axillary adenopathy.   Left: Without masses, retractions, nipple discharge or axillary adenopathy. Pelvic: External genitalia:  no lesions              Urethra:  normal appearing urethra with no masses, tenderness or lesions              Bartholins and Skenes: normal                 Vagina: + odor and discharge, mild erythema              Cervix: no lesions Bimanual Exam:  Uterus:  no masses or tenderness              Adnexa: no mass, fullness, tenderness              Rectovaginal: Deferred              Anus:  normal, no lesions  Patient informed chaperone available to be present for breast and pelvic exam. Patient has requested no chaperone to be present. Patient has been advised what will be completed during breast and pelvic exam.   Assessment/Plan:  53 y.o. G2P2002 for annual exam.   Well female exam with routine gynecological exam - Education provided on SBEs, importance of preventative screenings, current guidelines, high calcium diet, regular exercise, and multivitamin daily.  Labs through work.   Vaginal odor - Plan: WET PREP FOR TRICH, YEAST, CLUE. + clue cells.   Postmenopausal hormone therapy - Plan: estradiol (CLIMARA) 0.025 mg/24hr patch weekly, progesterone (PROMETRIUM) 100 MG capsule nightly.  Discussed management options for menopausal symptoms. Interested in HRT. Educated on risks and benefits of use. Recommend mag L-threonate for brain fog.   Bacterial vaginosis - Plan: metroNIDAZOLE  (FLAGYL ) 500 MG tablet BID x 7 days.   Screening for colon cancer - + Cologuard July 2024. Colonoscopy recommended but did not do because insurance would not cover. Wants to wait until this July to try to schedule one.   Postmenopausal - significant menopausal symptoms  Screening for cervical cancer - History of abnormal pap smears, negative colposcopies x2 while pregnant with daughter 20 years ago. No  interventions. Normal paps since but they have been infrequent.  Will repeat at 5-year interval per guidelines.   Screening for breast cancer - Normal mammogram history.  Continue annual screenings.  Normal breast exam today.  Return in about 4 weeks (around 06/17/2023) for Med follow up, annual exam 1 year. Andee Bamberger DNP, 2:07 PM 05/20/2023

## 2023-05-21 NOTE — Telephone Encounter (Signed)
 Patient had BP with GYN 126/84 weight 187lbs BMI 37.14 height 4' 11.5"  Met requirements for insurance discount Be Well 2026 HR Tonya notified given [facility] form and alternative requirements completion signed.

## 2023-06-17 ENCOUNTER — Ambulatory Visit (INDEPENDENT_AMBULATORY_CARE_PROVIDER_SITE_OTHER): Admitting: Nurse Practitioner

## 2023-06-17 ENCOUNTER — Encounter: Payer: Self-pay | Admitting: Nurse Practitioner

## 2023-06-17 DIAGNOSIS — Z79890 Hormone replacement therapy (postmenopausal): Secondary | ICD-10-CM | POA: Diagnosis not present

## 2023-06-17 MED ORDER — PROGESTERONE MICRONIZED 100 MG PO CAPS: 100.0000 mg | ORAL_CAPSULE | Freq: Every evening | ORAL | 2 refills | Status: DC

## 2023-06-17 MED ORDER — ESTRADIOL 0.025 MG/24HR TD PTTW: 1.0000 | MEDICATED_PATCH | TRANSDERMAL | 2 refills | Status: DC

## 2023-06-17 NOTE — Progress Notes (Signed)
   Acute Office Visit  Subjective:    Patient ID: Madison Garner, female    DOB: [birth year], 53 y.o.   MRN: [id number]   HPI 53 y.o. presents today for 4-week med follow up. Started HRT for menopausal symptoms - hot flashes, night sweats, trouble sleeping, weight gain, hair thinning, increased hair growth, brain fog, body odor. Has noticed improvement in most symptoms. Has only been taking about 3 weeks due to pharmacy not having in stock. Patch came off halfway through the week last week.   No LMP recorded. Patient is perimenopausal.    Review of Systems  Constitutional: Negative.   Endocrine: Positive for heat intolerance.  Psychiatric/Behavioral:  Positive for sleep disturbance.        Objective:     Physical Exam Constitutional:      Appearance: Normal appearance.     BP 118/78 (BP Location: Left Arm, Patient Position: Sitting)   Pulse 70   SpO2 96%  Wt Readings from Last 3 Encounters:  05/20/23 187 lb (84.8 kg)  05/11/23 187 lb (84.8 kg)  05/19/22 180 lb 4.8 oz (81.8 kg)         Assessment & Plan:   Problem List Items Addressed This Visit   None Visit Diagnoses       Postmenopausal hormone therapy       Relevant Medications   progesterone  (PROMETRIUM ) 100 MG capsule   estradiol  (VIVELLE -DOT) 0.025 MG/24HR (Start on 06/18/2023)      Plan: Will switch to twice weekly patch. Wants to continue current dose. Continue nightly Prometrium . Follow up as needed.   Return if symptoms worsen or fail to improve.    Andee Bamberger DNP, 2:17 PM 06/17/2023

## 2023-06-23 IMAGING — MG MM DIGITAL SCREENING BILAT W/ TOMO AND CAD
8 series · 8 of 24 positions shown · non-contrast
Comparison: Previous exam(s).

CLINICAL DATA: Screening.

EXAM:
DIGITAL SCREENING BILATERAL MAMMOGRAM WITH TOMOSYNTHESIS AND CAD
TECHNIQUE: Bilateral screening digital craniocaudal and mediolateral oblique
mammograms were obtained. Bilateral screening digital breast
tomosynthesis was performed. The images were evaluated with
computer-aided detection.

[R MLO synth-2D]
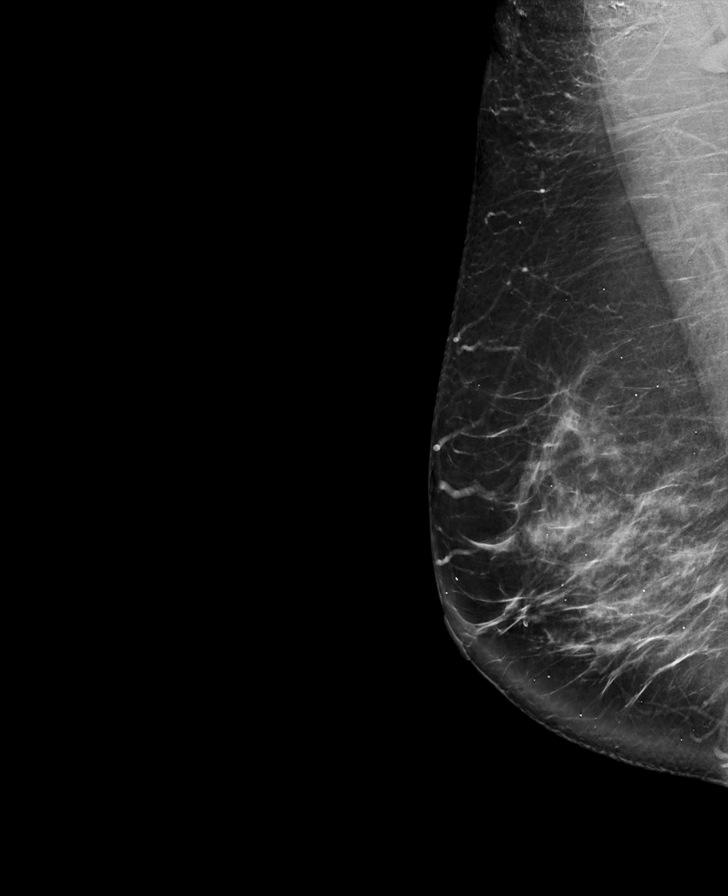

[L MLO synth-2D]
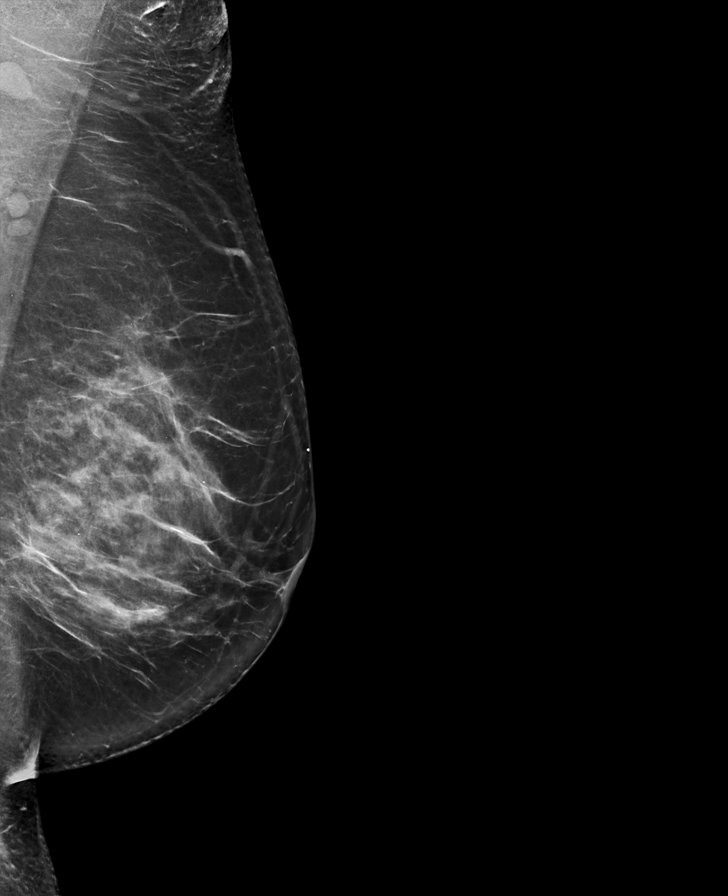

[R CC synth-2D]
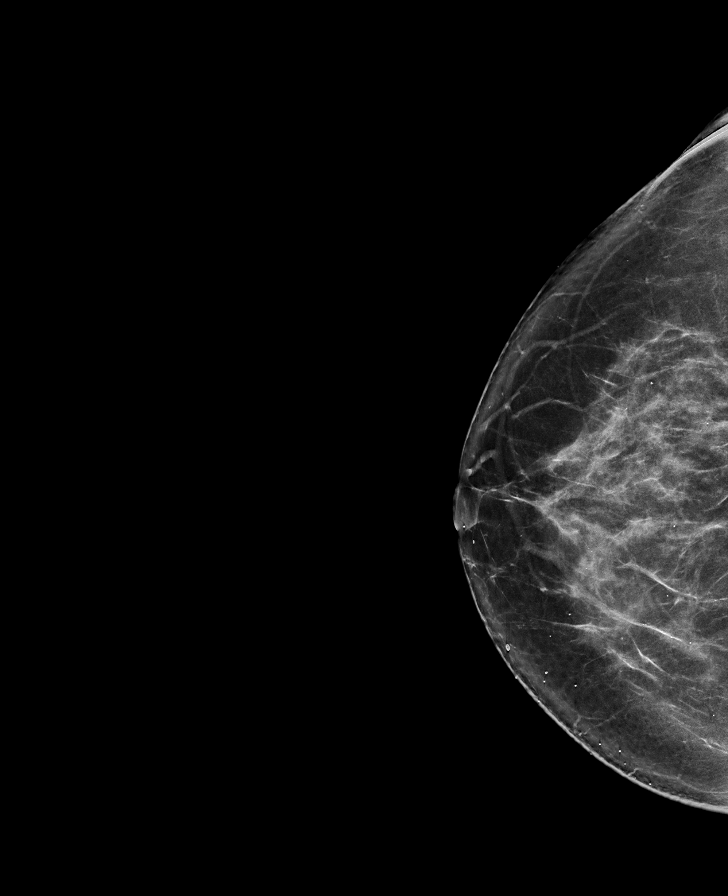

[L CC synth-2D]
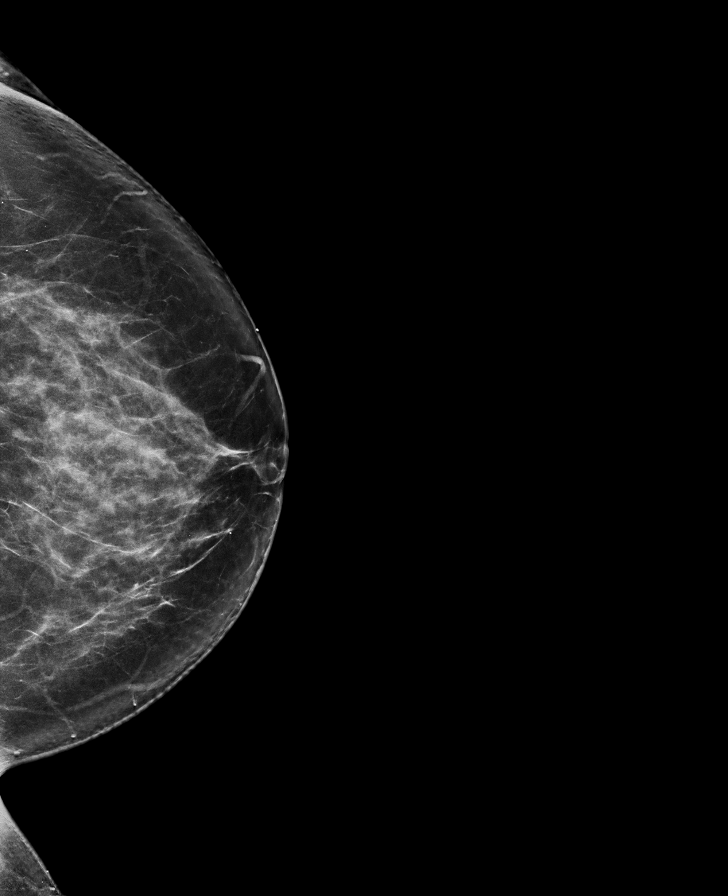

[L CC tomo · tomo slice 43/85.0]
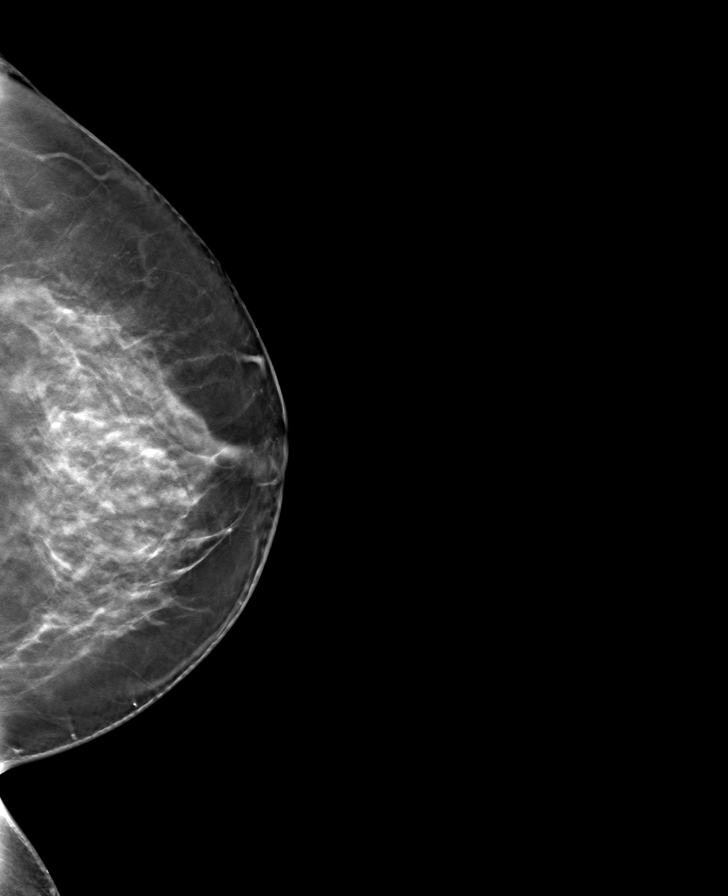

[R CC tomo · tomo slice 41/82.0]
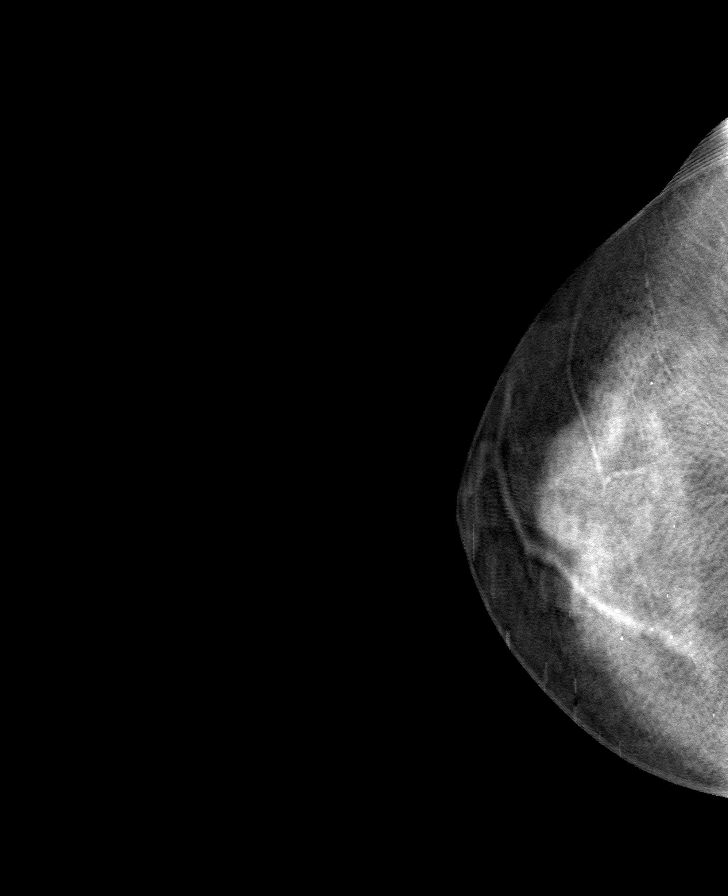

[L MLO tomo · tomo slice 45/90.0]
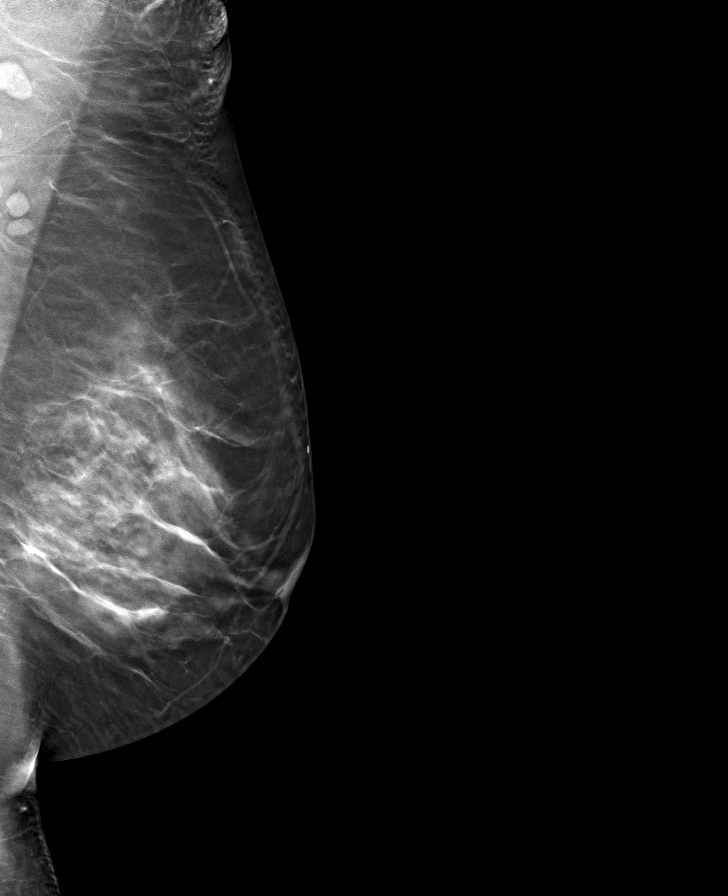

[R MLO tomo · tomo slice 47/94.0]
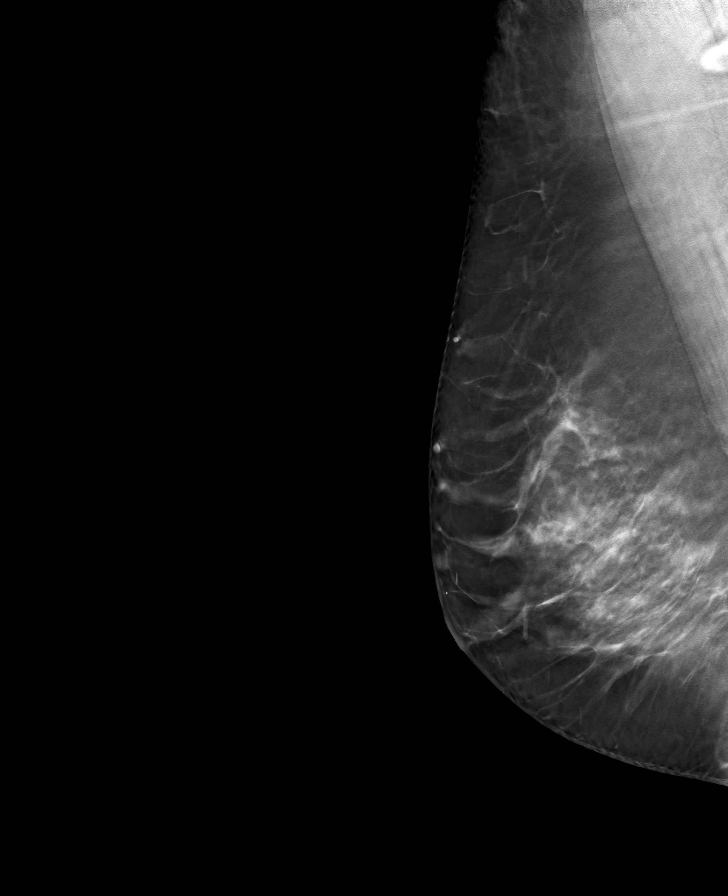

[8 of 24 positions shown; findings below may reference images not displayed]

ACR Breast Density Category c: The breast tissue is heterogeneously
dense, which may obscure small masses.
FINDINGS: There are no findings suspicious for malignancy.
IMPRESSION: No mammographic evidence of malignancy. A result letter of this
screening mammogram will be mailed directly to the patient.

RECOMMENDATION:
Screening mammogram in one year. (Code:Q3-W-BC3)

BI-RADS CATEGORY  1: Negative.

## 2023-06-25 ENCOUNTER — Ambulatory Visit: Payer: Self-pay | Admitting: Registered Nurse

## 2023-07-07 ENCOUNTER — Telehealth: Payer: Self-pay | Admitting: Registered Nurse

## 2023-07-07 DIAGNOSIS — J301 Allergic rhinitis due to pollen: Secondary | ICD-10-CM

## 2023-07-07 MED ORDER — CARBINOXAMINE MALEATE 4 MG PO TABS: 4.0000 mg | ORAL_TABLET | Freq: Two times a day (BID) | ORAL | 1 refills | Status: DC | PRN

## 2023-07-08 ENCOUNTER — Encounter: Payer: Self-pay | Admitting: Nurse Practitioner

## 2023-07-08 ENCOUNTER — Encounter: Payer: Self-pay | Admitting: Registered Nurse

## 2023-07-08 DIAGNOSIS — Z79890 Hormone replacement therapy (postmenopausal): Secondary | ICD-10-CM

## 2023-07-08 MED ORDER — ESTRADIOL 0.025 MG/24HR TD PTTW: 1.0000 | MEDICATED_PATCH | TRANSDERMAL | 3 refills | Status: AC

## 2023-07-08 MED ORDER — PROGESTERONE MICRONIZED 100 MG PO CAPS: 100.0000 mg | ORAL_CAPSULE | Freq: Every evening | ORAL | 3 refills | Status: DC

## 2023-07-08 NOTE — Telephone Encounter (Signed)
 Med refill request: Estradiol  0.025 mg patch twice weekly Progesterone  100 mg nightly  Last AEX: 05/20/23 -TW Next AEX: 05/23/24- TW Last MMG (if hormonal med) 02/18/23 -BiRads 1 neg Refill authorized: Please Advise?

## 2023-07-08 NOTE — Telephone Encounter (Signed)
 Patient reported the first month she took 1 tab BID but then started to develop daytime drowsiness so decreased to 1 tab daily and having good allergic rhinitis control and minimal daytime sleepiness.  Patient requested refill today electronic rx sent to her pharmacy of choice carbinoxamine  mealeate 4mg  po BID #60 RF1  Patient doesn't typically take allergy medication year round only during peak symptoms.  Patient notified Rx sent to her pharmacy and had no further questions at this time.

## 2023-07-30 ENCOUNTER — Other Ambulatory Visit: Payer: Self-pay | Admitting: Registered Nurse

## 2023-07-30 DIAGNOSIS — Z0000 Encounter for general adult medical examination without abnormal findings: Secondary | ICD-10-CM

## 2023-09-29 ENCOUNTER — Encounter: Payer: Self-pay | Admitting: Registered Nurse

## 2023-09-29 ENCOUNTER — Other Ambulatory Visit: Payer: Self-pay | Admitting: Registered Nurse

## 2023-09-29 DIAGNOSIS — J301 Allergic rhinitis due to pollen: Secondary | ICD-10-CM

## 2023-09-29 NOTE — Telephone Encounter (Signed)
 Refilled electronic rx sent to her pharmacy of choice #60 RF0 patient not taking BID because makes her too drowsy using prn for seasonal allergies

## 2023-11-05 ENCOUNTER — Other Ambulatory Visit: Payer: Self-pay | Admitting: Nurse Practitioner

## 2023-11-05 ENCOUNTER — Encounter: Payer: Self-pay | Admitting: Nurse Practitioner

## 2023-11-05 DIAGNOSIS — Z79890 Hormone replacement therapy (postmenopausal): Secondary | ICD-10-CM

## 2023-11-05 MED ORDER — PROGESTERONE 200 MG PO CAPS: 200.0000 mg | ORAL_CAPSULE | Freq: Every evening | ORAL | 1 refills | Status: DC

## 2023-11-05 NOTE — Telephone Encounter (Signed)
 Left message to call [facility] Triage at [phone], option 4.

## 2023-11-05 NOTE — Telephone Encounter (Signed)
 No PUS on file.     Tiffany -please review and advise on scheduling.

## 2023-12-21 ENCOUNTER — Other Ambulatory Visit: Payer: Self-pay | Admitting: Registered Nurse

## 2023-12-21 ENCOUNTER — Encounter: Payer: Self-pay | Admitting: Registered Nurse

## 2023-12-21 DIAGNOSIS — J301 Allergic rhinitis due to pollen: Secondary | ICD-10-CM

## 2023-12-21 NOTE — Telephone Encounter (Signed)
 Patient reported she still has been taking as needed usually only daily due to drowsiness if taking twice a day.  Working well to control congestion and post nasal drip.  Electronic Rx sent to her pharmacy of choice carbinoxamine  maleate 4mg  po BID prn rhinitis #60 RF1

## 2023-12-24 ENCOUNTER — Other Ambulatory Visit: Payer: Self-pay | Admitting: Nurse Practitioner

## 2023-12-24 DIAGNOSIS — Z79890 Hormone replacement therapy (postmenopausal): Secondary | ICD-10-CM

## 2023-12-24 MED ORDER — PROGESTERONE 200 MG PO CAPS: 200.0000 mg | ORAL_CAPSULE | Freq: Every day | ORAL | 1 refills | Status: AC

## 2023-12-24 NOTE — Telephone Encounter (Signed)
 Med refill request: progesterone  200 mg Last AEX: 05/20/23 Next AEX: 05/23/24 Last MMG (if hormonal med) 02/18/23 BI-RADS 1 negative Refill authorized: progesterone  200 mg #30, with 4 refills.

## 2024-01-19 ENCOUNTER — Ambulatory Visit: Payer: Self-pay | Admitting: Registered Nurse

## 2024-01-19 ENCOUNTER — Encounter: Payer: Self-pay | Admitting: Registered Nurse

## 2024-01-19 VITALS — BP 142/88 | HR 70 | Temp 98.5°F | Resp 18

## 2024-01-19 DIAGNOSIS — J209 Acute bronchitis, unspecified: Secondary | ICD-10-CM

## 2024-01-19 DIAGNOSIS — J0101 Acute recurrent maxillary sinusitis: Secondary | ICD-10-CM

## 2024-01-19 DIAGNOSIS — R030 Elevated blood-pressure reading, without diagnosis of hypertension: Secondary | ICD-10-CM

## 2024-01-19 DIAGNOSIS — H6691 Otitis media, unspecified, right ear: Secondary | ICD-10-CM

## 2024-01-19 MED ORDER — SALINE SPRAY 0.65 % NA SOLN: 2.0000 | NASAL | Status: AC

## 2024-01-19 MED ORDER — ALBUTEROL SULFATE HFA 108 (90 BASE) MCG/ACT IN AERS: 1.0000 | INHALATION_SPRAY | RESPIRATORY_TRACT | 0 refills | Status: AC | PRN

## 2024-01-19 MED ORDER — AMOXICILLIN-POT CLAVULANATE 875-125 MG PO TABS: 1.0000 | ORAL_TABLET | Freq: Two times a day (BID) | ORAL | 0 refills | Status: AC

## 2024-01-19 MED ORDER — PREDNISONE 10 MG PO TABS: ORAL_TABLET | ORAL | Status: AC

## 2024-01-19 NOTE — Patient Instructions (Signed)
 Otitis Media, Adult  Otitis media occurs when there is inflammation and fluid in the middle ear with signs and symptoms of an acute infection. The middle ear is a part of the ear that contains bones for hearing as well as air that helps send sounds to the brain. When infected fluid builds up in this space, it causes pressure and can lead to an ear infection. The eustachian tube connects the middle ear to the back of the nose (nasopharynx) and normally allows air into the middle ear. If the eustachian tube becomes blocked, fluid can build up and become infected. What are the causes? This condition is caused by a blockage in the eustachian tube. This can be caused by mucus or by swelling of the tube. Problems that can cause a blockage include: A cold or other upper respiratory infection. Allergies. An irritant, such as tobacco smoke. Enlarged adenoids. The adenoids are areas of soft tissue located high in the back of the throat, behind the nose and the roof of the mouth. They are part of the body's defense system (immune system). A mass in the nasopharynx. Damage to the ear caused by pressure changes (barotrauma). What increases the risk? You are more likely to develop this condition if you: Smoke or are exposed to tobacco smoke. Have an opening in the roof of your mouth (cleft palate). Have gastroesophageal reflux. Have an immune system disorder. What are the signs or symptoms? Symptoms of this condition include: Ear pain. Fever. Decreased hearing. Tiredness (lethargy). Fluid leaking from the ear, if the eardrum is ruptured or has burst. Ringing in the ear. How is this diagnosed?  This condition is diagnosed with a physical exam. During the exam, your health care provider will use an instrument called an otoscope to look in your ear and check for redness, swelling, and fluid. He or she will also ask about your symptoms. Your health care provider may also order tests, such as: A pneumatic  otoscopy. This is a test to check the movement of the eardrum. It is done by squeezing a small amount of air into the ear. A tympanogram. This is a test that shows how well the eardrum moves in response to air pressure in the ear canal. It provides a graph for your health care provider to review. How is this treated? This condition can go away on its own within 3-5 days. But if the condition is caused by a bacterial infection and does not go away on its own, or if it keeps coming back, your health care provider may: Prescribe antibiotic medicine to treat the infection. Prescribe or recommend medicines to control pain. Follow these instructions at home: Take over-the-counter and prescription medicines only as told by your health care provider. If you were prescribed an antibiotic medicine, take it as told by your health care provider. Do not stop taking the antibiotic even if you start to feel better. Keep all follow-up visits. This is important. Contact a health care provider if: You have bleeding from your nose. There is a lump on your neck. You are not feeling better in 5 days. You feel worse instead of better. Get help right away if: You have severe pain that is not controlled with medicine. You have swelling, redness, or pain around your ear. You have stiffness in your neck. A part of your face is not moving (paralyzed). The bone behind your ear (mastoid bone) is tender when you touch it. You develop a severe headache. Summary Otitis media  is redness, soreness, and swelling of the middle ear, usually resulting in pain and decreased hearing. This condition can go away on its own within 3-5 days. If the problem does not go away in 3-5 days, your health care provider may give you medicines to treat the infection. If you were prescribed an antibiotic medicine, take it as told by your health care provider. Follow all instructions that were given to you by your health care provider. This  information is not intended to replace advice given to you by your health care provider. Make sure you discuss any questions you have with your health care provider. Document Revised: 04/16/2020 Document Reviewed: 04/16/2020 Elsevier Patient Education  2024 [employer]How to Perform a Sinus Rinse A sinus rinse is a home treatment that is used to rinse your sinuses with a germ-free (sterile) mixture of salt and water (saline solution). Sinuses are air-filled spaces in your skull that are behind the bones of your face and forehead. They open into your nasal cavity. A sinus rinse can help to clear mucus, dirt, dust, or pollen from your nasal cavity. You may do a sinus rinse when you have a cold, a virus, nasal allergy symptoms, a sinus infection, or stuffiness in your nose or sinuses. What are the risks? A sinus rinse is generally safe and effective. However, there are a few risks, which include: A burning sensation in your sinuses. This may happen if you do not make the saline solution as directed. Be sure to follow all directions when making the saline solution. Nasal irritation. Infection. This may be from unclean supplies or from contaminated water. Infection from contaminated water is rare, but possible. Do not do a sinus rinse if you have had ear or nasal surgery, ear infection, or plugged ears, unless recommended by your health care provider. Supplies needed: Saline solution or powder. Distilled or sterile water to mix with saline powder. You may use boiled and cooled tap water. Boil tap water for 5 minutes; cool until it is lukewarm. Use within 24 hours. Do not use regular tap water to mix with the saline solution. Neti pot or nasal rinse bottle. These supplies release the saline solution into your nose and through your sinuses. Neti pots and nasal rinse bottles can be purchased at charity fundraiser, a health food store, or online. How to perform a sinus rinse  Wash your hands with soap  and water for at least 20 seconds. If soap and water are not available, use hand sanitizer. Wash your device according to the directions that came with the product and then dry it. Use the solution that comes with your product or one that is sold separately in stores. Follow the mixing directions on the package to mix with sterile or distilled water. Fill the device with the amount of saline solution noted in the device instructions. Stand by a sink and tilt your head sideways over the sink. Place the spout of the device in your upper nostril (the one closer to the ceiling). Gently pour or squeeze the saline solution into your nasal cavity. The liquid should drain out from the lower nostril if you are not too congested. While rinsing, breathe through your open mouth. Gently blow your nose to clear any mucus and rinse solution. Blowing too hard may cause ear pain. Turn your head in the other direction and repeat in your other nostril. Clean and rinse your device with clean water and then air-dry it. Talk with your health care  provider or pharmacist if you have questions about how to do a sinus rinse. Summary A sinus rinse is a home treatment that is used to rinse your sinuses with a sterile mixture of salt and water (saline solution). You may do a sinus rinse when you have a cold, a virus, nasal allergy symptoms, a sinus infection, or stuffiness in your nose or sinuses. A sinus rinse is generally safe and effective. Follow all instructions carefully. This information is not intended to replace advice given to you by your health care provider. Make sure you discuss any questions you have with your health care provider. Document Revised: 06/25/2020 Document Reviewed: 06/25/2020 Elsevier Patient Education  2024 [employer]Allergic Rhinitis, Adult  Allergic rhinitis is an allergic reaction that affects the mucous membrane inside the nose. The mucous membrane is the tissue that produces  mucus. There are two types of allergic rhinitis: Seasonal. This type is also called hay fever and happens only during certain seasons. Perennial. This type can happen at any time of the year. Allergic rhinitis cannot be spread from person to person. This condition can be mild, bad, or very bad. It can develop at any age and may be outgrown. What are the causes? This condition is caused by allergens. These are things that can cause an allergic reaction. Allergens may differ for seasonal allergic rhinitis and perennial allergic rhinitis. Seasonal allergic rhinitis is caused by pollen. Pollen can come from grasses, trees, and weeds. Perennial allergic rhinitis may be caused by: Dust mites. Proteins in a pet's pee (urine), saliva, or dander. Dander is dead skin cells from a pet. Smoke, mold, or car fumes. Remains of or waste from insects such as cockroaches. What increases the risk? You are more likely to develop this condition if you have a family history of allergies or other conditions related to allergies, including: Allergic conjunctivitis. This is irritation and swelling of parts of the eyes and eyelids. Asthma. This condition affects the lungs and makes it hard to breathe. Atopic dermatitis or eczema. This is long term (chronic) irritation and swelling of the skin. Food allergies. What are the signs or symptoms? Symptoms of this condition include: Sneezing or coughing. A stuffy nose (nasal congestion), itchy nose, or nasal discharge. Itchy eyes and tearing of the eyes. A feeling of mucus dripping down the back of your throat (postnasal drip). This may cause a sore throat. Trouble sleeping. Tiredness. Headache. How is this diagnosed? This condition may be diagnosed with your symptoms, your medical history, and a physical exam. Your health care provider may check for related conditions, such as: Asthma. Pink eye. This is eye swelling and irritation caused by infection  (conjunctivitis). Ear infection. Upper respiratory infection. This is an infection in the nose, throat, or upper airways. You may also have tests to find out which allergens cause your symptoms. These may include skin tests or blood tests. How is this treated? There is no cure for this condition, but treatment can help control symptoms. Treatment may include: Taking medicines that block allergy symptoms, such as corticosteroids (anti-inflammatories) and antihistamines. Medicine may be given as a shot, nasal spray, or pill. Avoiding any allergens. Being exposed again and again to tiny amounts of allergens to help you build a defense against allergens (allergenimmunotherapy). This is done if other treatments have not helped. It may include: Allergy shots. These are injected medicines that have small amounts of an allergen in them. Sublingual immunotherapy. This involves taking small doses of a medicine with an  allergen in it under your tongue. If these treatments do not work, your provider may prescribe newer, stronger medicines. Follow these instructions at home: Avoiding allergens Find out what you are allergic to and avoid those allergens. These are some things you can do to help avoid allergens: If you have perennial allergies: Replace carpet with wood, tile, or vinyl flooring. Carpet can trap dander and dust. Do not smoke. Do not allow smoking in your home Change your heating and air conditioning filters at least once a month. If you have seasonal allergies, take these steps during allergy season: Keep windows closed as much as possible. Plan outdoor activities when pollen counts are lowest. Check pollen counts before you plan outdoor activities When coming indoors, change clothing and shower before sitting on furniture or bedding. If you have a pet in the house that produces allergens: Keep the pet out of the bedroom. Vacuum, sweep, and dust regularly. General instructions Take  over-the-counter and prescription medicines only as told by your provider. Drink enough fluid to keep your pee pale yellow. Where to find more information American [employer]hma [employer]ology: aaaai.org Contact a health care provider if: You have a fever. You develop a cough that does not go away. You make high-pitched whistling sounds when you breathe, most often when you breathe out (wheeze). Your symptoms slow you down or stop you from doing your normal activities each day. Get help right away if: You have shortness of breath. This symptom may be an emergency. Get help right away. Call 911. Do not wait to see if the symptoms will go away. Do not drive yourself to the hospital. This information is not intended to replace advice given to you by your health care provider. Make sure you discuss any questions you have with your health care provider. Document Revised: 09/16/2021 Document Reviewed: 09/16/2021 Elsevier Patient Education  2024 [employer]Viral Respiratory Infection A respiratory infection is an illness that affects part of the respiratory system, such as the lungs, nose, or throat. A respiratory infection that is caused by a virus is called a viral respiratory infection. Common types of viral respiratory infections include: A cold. The flu (influenza). A respiratory syncytial virus (RSV) infection. What are the causes? This condition is caused by a virus. The virus may spread through contact with droplets or direct contact with infected people or their mucus or secretions. The virus may spread from person to person (is contagious). What are the signs or symptoms? Symptoms of this condition include: A stuffy or runny nose. A sore throat or cough. Shortness of breath or difficulty breathing. Yellow or green mucus (sputum). Other symptoms may include: A fever. Sweating or chills. Fatigue. Achy muscles. A headache. How is this diagnosed? This condition may  be diagnosed based on: Your symptoms. A physical exam. Testing of secretions from the nose or throat. Chest X-ray. How is this treated? This condition may be treated with medicines, such as: Antiviral medicine. This may shorten the length of time a person has symptoms. Expectorants. These make it easier to cough up mucus. Decongestant nasal sprays. Acetaminophen  or NSAIDs, such as ibuprofen, to relieve fever and pain. Antibiotic medicines are not prescribed for viral infections.This is because antibiotics are designed to kill bacteria. They do not kill viruses. Follow these instructions at home: Managing pain and congestion Take over-the-counter and prescription medicines only as told by your health care provider. If you have a sore throat, gargle with a mixture of salt and water  3-4 times a day or as needed. To make salt water, completely dissolve -1 tsp (3-6 g) of salt in 1 cup (237 mL) of warm water. Use nose drops made from salt water to ease congestion and soften raw skin around your nose. Take 2 tsp (10 mL) of honey at bedtime to lessen coughing at night. Do not give honey to children who are younger than 1 year. Drink enough fluid to keep your urine pale yellow. This helps prevent dehydration and helps loosen up mucus. General instructions  Rest as much as possible. Do not drink alcohol. Do not use any products that contain nicotine or tobacco. These products include cigarettes, chewing tobacco, and vaping devices, such as e-cigarettes. If you need help quitting, ask your health care provider. Keep all follow-up visits. This is important. How is this prevented?     Get an annual flu shot. You may get the flu shot in late summer, fall, or winter. Ask your health care provider when you should get your flu shot. Avoid spreading your infection to other people. If you are sick: Wash your hands with soap and water often, especially after you cough or sneeze. Wash for at least 20  seconds. If soap and water are not available, use alcohol-based hand sanitizer. Cover your mouth when you cough. Cover your nose and mouth when you sneeze. Do not share cups or eating utensils. Clean commonly used objects often. Clean commonly touched surfaces. Stay home from work or school as told by your health care provider. Avoid contact with people who are sick during cold and flu season. This is generally fall and winter. Contact a health care provider if: Your symptoms last for 10 days or longer. Your symptoms get worse over time. You have severe sinus pain in your face or forehead. The glands in your jaw or neck become very swollen. You have shortness of breath. Get help right away if you: Feel pain or pressure in your chest. Have trouble breathing. Faint or feel like you will faint. Have severe and persistent vomiting. Feel confused or disoriented. These symptoms may represent a serious problem that is an emergency. Do not wait to see if the symptoms will go away. Get medical help right away. Call your local emergency services (911 in the [place] Do not drive yourself to the hospital. Summary A respiratory infection is an illness that affects part of the respiratory system, such as the lungs, nose, or throat. A respiratory infection that is caused by a virus is called a viral respiratory infection. Common types of viral respiratory infections include a cold, influenza, and respiratory syncytial virus (RSV) infection. Symptoms of this condition include a stuffy or runny nose, cough, fatigue, achy muscles, sore throat, and fevers or chills. Antibiotic medicines are not prescribed for viral infections. This is because antibiotics are designed to kill bacteria. They are not effective against viruses. This information is not intended to replace advice given to you by your health care provider. Make sure you discuss any questions you have with your health care provider. Document Revised:  04/12/2020 Document Reviewed: 04/12/2020 Elsevier Patient Education  2024 [employer].

## 2024-01-19 NOTE — Progress Notes (Signed)
 "  Established Patient Office Visit  Subjective   Patient ID: Madison Garner, female    DOB: [birth year]  Age: 53 y.o. MRN: [id number]  Chief Complaint  Patient presents with   ear and sinus pain    Carbinoxamine  helping but still pressure ears and sinuses    53y/o caucasian female established here for evaluation ear and sinus pain.  Thinks it was allergy flare with weather change 70s to 20s in the past week.  Carbinoxamine  helping but due to drowsiness only taking 1/2 tablet.  Denied fever/chills/n/v/d/teeth pain.  Has had sick contacts at work.  Mucous clear.  Denied foul taste/brown/chunky mucous.  Throat sore has tried soup broths and throat feels swollen      Review of Systems  Constitutional:  Positive for malaise/fatigue. Negative for chills, diaphoresis and fever.  HENT:  Positive for congestion, ear pain, sinus pain and sore throat. Negative for ear discharge, hearing loss, nosebleeds and tinnitus.   Eyes:  Negative for pain, discharge and redness.  Respiratory:  Positive for cough and sputum production. Negative for hemoptysis, shortness of breath and stridor.   Cardiovascular:  Negative for chest pain.  Gastrointestinal:  Negative for abdominal pain, diarrhea, nausea and vomiting.  Musculoskeletal:  Negative for back pain, falls, joint pain and neck pain.  Skin:  Negative for rash.  Neurological:  Positive for headaches. Negative for dizziness, tingling, tremors, speech change, seizures, loss of consciousness and weakness.  Endo/Heme/Allergies:  Positive for environmental allergies.      Objective:     BP (!) 142/88 (BP Location: Left Arm, Patient Position: Sitting, Cuff Size: Large) Comment (Cuff Size): Manual  Pulse 70   Temp 98.5 F (36.9 C) (Tympanic)   Resp 18   SpO2 100%    Physical Exam Vitals and nursing note reviewed.  Constitutional:      General: She is not in acute distress.    Appearance: She is well-developed and overweight. She is ill-appearing.  She is not toxic-appearing or diaphoretic.  HENT:     Head: Normocephalic and atraumatic.     Jaw: There is normal jaw occlusion. No trismus or pain on movement.     Salivary Glands: Right salivary gland is not diffusely enlarged or tender. Left salivary gland is not diffusely enlarged or tender.     Right Ear: Hearing, ear canal and external ear normal. No decreased hearing noted. No laceration, drainage, swelling or tenderness. A middle ear effusion is present. There is no impacted cerumen. No foreign body. No mastoid tenderness. No PE tube. No hemotympanum. Tympanic membrane is injected and erythematous. Tympanic membrane is not perforated or retracted.     Left Ear: Hearing, ear canal and external ear normal. No decreased hearing noted. No laceration, drainage, swelling or tenderness. A middle ear effusion is present. There is no impacted cerumen. No foreign body. No mastoid tenderness. No PE tube. Tympanic membrane is not injected, scarred, perforated, erythematous or retracted.     Nose: Mucosal edema, congestion and rhinorrhea present. No signs of injury or laceration. Rhinorrhea is clear.     Right Nostril: No epistaxis.     Left Nostril: No epistaxis.     Right Turbinates: Enlarged and swollen. Not pale.     Left Turbinates: Enlarged and swollen. Not pale.     Right Sinus: Maxillary sinus tenderness present. No frontal sinus tenderness.     Left Sinus: Maxillary sinus tenderness present. No frontal sinus tenderness.     Mouth/Throat:     Lips:  Pink. No lesions.     Mouth: No injury, lacerations, oral lesions or angioedema.     Dentition: No gum lesions.     Tongue: No lesions. Tongue does not deviate from midline.     Palate: No mass and lesions.     Pharynx: Uvula midline. Pharyngeal swelling, posterior oropharyngeal erythema and postnasal drip present. No oropharyngeal exudate or uvula swelling.     Tonsils: No tonsillar exudate or tonsillar abscesses. 1+ on the right. 1+ on the left.      Comments: Bilateral allergic shiners; cobblestoning posterior pharynx; nasal congestion audible; nasal turbinates edema/erythema clear discharge bilaterally; bilateral TMs intact air fluid level clear right TM injection and erythema along with proximal auditory canal right only no debris noted in bilateral auditory canals Eyes:     General: Lids are normal. Vision grossly intact. Gaze aligned appropriately. Allergic shiner present.        Right eye: No discharge.        Left eye: No discharge.     Conjunctiva/sclera: Conjunctivae normal.     Pupils: Pupils are equal, round, and reactive to light.  Neck:     Trachea: Trachea and phonation normal.     Comments: Anterior cervical lymph nodes TTP but no shottiness or discrete enlarged lymph nodes palpated Cardiovascular:     Rate and Rhythm: Normal rate and regular rhythm.     Pulses:          Radial pulses are 2+ on the right side and 2+ on the left side.     Heart sounds: Normal heart sounds, S1 normal and S2 normal.  Pulmonary:     Effort: Pulmonary effort is normal. No respiratory distress.     Breath sounds: Normal air entry. No stridor. Examination of the right-lower field reveals wheezing. Wheezing present. No decreased breath sounds, rhonchi or rales.     Comments: RLL with intermittent inspiratory wheeze on auscultation; no cough observed in clinic spoke full sentences without difficulty; throat clearing and nasal sniffing observed Abdominal:     Palpations: Abdomen is soft.  Musculoskeletal:        General: Normal range of motion.     Right hand: Normal strength. Normal capillary refill.     Left hand: Normal strength. Normal capillary refill.     Cervical back: Normal range of motion and neck supple. No swelling, edema, deformity, erythema, signs of trauma, lacerations, rigidity, torticollis, tenderness or crepitus. Pain with movement and muscular tenderness present. No spinous process tenderness. Normal range of motion.      Thoracic back: No swelling, edema, deformity, signs of trauma, lacerations, spasms or tenderness. Normal range of motion.     Right lower leg: No edema.     Left lower leg: No edema.  Lymphadenopathy:     Head:     Right side of head: No submental, submandibular, tonsillar, preauricular, posterior auricular or occipital adenopathy.     Left side of head: No submental, submandibular, tonsillar, preauricular, posterior auricular or occipital adenopathy.     Cervical: No cervical adenopathy.     Right cervical: No superficial, deep or posterior cervical adenopathy.    Left cervical: No superficial, deep or posterior cervical adenopathy.     Comments: Anterior superficial cervical lymph nodes mildly TTP  Skin:    General: Skin is warm and dry.     Capillary Refill: Capillary refill takes less than 2 seconds.     Coloration: Skin is not ashen, cyanotic, jaundiced, mottled, pale or  sallow.     Findings: No abrasion, abscess, acne, bruising, burn, ecchymosis, erythema, signs of injury, laceration, lesion, petechiae, rash or wound.     Nails: There is no clubbing.     Comments: Anterior face/neck and hands visually inspected  Neurological:     General: No focal deficit present.     Mental Status: She is alert and oriented to person, place, and time. Mental status is at baseline.     GCS: GCS eye subscore is 4. GCS verbal subscore is 5. GCS motor subscore is 6.     Cranial Nerves: No cranial nerve deficit, dysarthria or facial asymmetry.     Sensory: Sensation is intact.     Motor: Motor function is intact. No weakness, tremor or abnormal muscle tone.     Coordination: Coordination normal.     Gait: Gait normal.     Comments: In/out of chair and on/off exam table without difficulty; gait sure and steady in clinic; bilateral hand grasp equal  Psychiatric:        Speech: Speech normal.        Behavior: Behavior normal. Behavior is cooperative.        Thought Content: Thought content normal.         Judgment: Judgment normal.      No results found for any visits on 01/19/24.    The 10-year ASCVD risk score (Arnett DK, et al., 2019) is: 1.7%    Assessment & Plan:   Problem List Items Addressed This Visit   None Visit Diagnoses       Right otitis media, unspecified otitis media type    -  Primary   Relevant Medications   amoxicillin -clavulanate (AUGMENTIN ) 875-125 MG tablet     Acute bronchitis, unspecified organism         Acute recurrent maxillary sinusitis       Relevant Medications   amoxicillin -clavulanate (AUGMENTIN ) 875-125 MG tablet   predniSONE  (DELTASONE ) 10 MG tablet   sodium chloride (OCEAN) 0.65 % SOLN nasal spray     Supportive treatment. Augmentin  875mg  po BID x 10 days #20 RF0 dispensed from [facility] to patient  Tylenol  1000mg  po QID prn pain/fever.   No evidence of invasive bacterial infection, non toxic and well hydrated.  This is most likely self limiting viral infection.  I do not see where any further testing or imaging is necessary at this time.   I will suggest supportive care, rest, good hygiene and encourage the patient to take adequate fluids.  The patient is to return to clinic or EMERGENCY ROOM if symptoms worsen or change significantly e.g. ear pain, fever, purulent discharge from ears or bleeding.  Exitcare handout on otitis media   Patient verbalized agreement and understanding of treatment plan.     Cough lozenges po prn per manufacturer instructions Prednisone  taper 10mg  (40x2 days/30mg x2days/20mg x2days/10mg x2 days) po daily with breakfast #21 RF0 dispensed from [facility].  Discussed possible side effects increased/decreased appetite, difficulty sleeping, increased blood sugar, increased blood pressure and heart rate.  Albuterol MDI 90mcg 1-2 puffs po q4-6h prn protracted cough/wheeze #1 RF0 side effect increased heart rate. Bronchitis simple, community acquired, may have started as viral (probably respiratory syncytial, parainfluenza, influenza, or  adenovirus), but now evidence of acute purulent bronchitis with resultant bronchial edema and mucus formation.  Viruses are the most common cause of bronchial inflammation in otherwise healthy adults with acute bronchitis.  The appearance of sputum is not predictive of whether a bacterial infection is present.  Purulent sputum is most often caused by viral infections.  There are a small portion of those caused by non-viral agents being Mycoplama pneumonia.  Microscopic examination or C&S of sputum in the healthy adult with acute bronchitis is generally not helpful (usually negative or normal respiratory flora) other considerations being cough from upper respiratory tract infections, sinusitis or allergic syndromes (mild asthma or viral pneumonia).  Differential Diagnoses:  reactive airway disease (asthma, allergic aspergillosis (eosinophilia), chronic bronchitis, respiratory infection (sinusitis, common cold, pneumonia), congestive heart failure, reflux esophagitis, bronchogenic tumor, aspiration syndromes and/or exposure to pulmonary irritants/smoke.  Without high fever, severe dyspnea, lack of physical findings or other risk factors, I will hold on a chest radiograph and CBC at this time.  I discussed that approximately 50% of patients with acute bronchitis have a cough that lasts up to three weeks, and 25% for over a month.  Tylenol  500mg  one to two tablets every four to six hours as needed for fever or myalgias.  No aspirin. Exitcare handouts on bronchitis and inhaler use given to patient.  ER if hemopthysis, SOB, worst chest pain of life.   Patient instructed to follow up in one week or sooner if symptoms worsen.  Patient verbalized agreement and understanding of treatment plan.  P2:  hand washing and cover cough   Continue carbinoxamine  4mg  po BID prn.  Restart flonase  1 spray each nostril BID, saline 2 sprays each nostril q2h wa prn congestion.  If no improvement with 48 hours of saline and flonase  use  start augmentin  875mg  po BID x 10 days #20 RF0 dispensed from [facility] to patient  Denied personal or family history of ENT cancer.  Shower BID especially prior to bed. No evidence of systemic bacterial infection, non toxic and well hydrated.  I do not see where any further testing or imaging is necessary at this time.   I will suggest supportive care, rest, good hygiene and encourage the patient to take adequate fluids.  The patient is to return to clinic or EMERGENCY ROOM if symptoms worsen or change significantly.  Exitcare handouts on sinusitis and sinus rinse.  Patient verbalized agreement and understanding of treatment plan and had no further questions at this time.    Pain and otc cough/cold medication most likely causing elevated blood pressure.  Re-evaluation once illness resolved prn with RN Karene.  Will decrease caffeine intake while on prednisone .  ER if chest pain, worst headache of life, dyspnea or visual changes for re-evaluation.  P2:  Hand washing and cover cough   Return if symptoms worsen or fail to improve.    Ellouise DELENA Hope, NP  "

## 2024-05-23 ENCOUNTER — Ambulatory Visit: Admitting: Nurse Practitioner
